# Patient Record
Sex: Female | Born: 1970
Health system: Southern US, Community
[De-identification: ages and names within clinical notes are randomized; demographics above are authoritative.]

## PROBLEM LIST (undated history)

## (undated) DIAGNOSIS — Z87448 Personal history of other diseases of urinary system: Secondary | ICD-10-CM

## (undated) DIAGNOSIS — F419 Anxiety disorder, unspecified: Secondary | ICD-10-CM

## (undated) DIAGNOSIS — T4145XA Adverse effect of unspecified anesthetic, initial encounter: Secondary | ICD-10-CM

## (undated) DIAGNOSIS — T8859XA Other complications of anesthesia, initial encounter: Secondary | ICD-10-CM

## (undated) DIAGNOSIS — N189 Chronic kidney disease, unspecified: Secondary | ICD-10-CM

## (undated) DIAGNOSIS — I10 Essential (primary) hypertension: Secondary | ICD-10-CM

## (undated) DIAGNOSIS — R112 Nausea with vomiting, unspecified: Secondary | ICD-10-CM

## (undated) DIAGNOSIS — K219 Gastro-esophageal reflux disease without esophagitis: Secondary | ICD-10-CM

## (undated) DIAGNOSIS — Z9889 Other specified postprocedural states: Secondary | ICD-10-CM

## (undated) HISTORY — PX: ABDOMINAL HYSTERECTOMY: SHX81

## (undated) HISTORY — PX: TOTAL ABDOMINAL HYSTERECTOMY: SHX209

## (undated) HISTORY — DX: Gastro-esophageal reflux disease without esophagitis: K21.9

## (undated) HISTORY — DX: Personal history of other diseases of urinary system: Z87.448

---

## 2005-02-23 ENCOUNTER — Ambulatory Visit: Payer: Self-pay | Admitting: Family Medicine

## 2005-09-13 ENCOUNTER — Ambulatory Visit: Payer: Self-pay | Admitting: Family Medicine

## 2009-10-22 ENCOUNTER — Encounter (INDEPENDENT_AMBULATORY_CARE_PROVIDER_SITE_OTHER): Payer: Self-pay | Admitting: Obstetrics & Gynecology

## 2009-10-22 ENCOUNTER — Inpatient Hospital Stay (HOSPITAL_COMMUNITY): Admission: RE | Admit: 2009-10-22 | Discharge: 2009-10-23 | Payer: Self-pay | Admitting: Obstetrics & Gynecology

## 2010-09-13 LAB — CBC
Platelets: 237 10*3/uL (ref 150–400)
Platelets: 245 10*3/uL (ref 150–400)
RDW: 14.6 % (ref 11.5–15.5)
WBC: 7.9 10*3/uL (ref 4.0–10.5)

## 2010-09-13 LAB — TYPE AND SCREEN
ABO/RH(D): O POS
Antibody Screen: NEGATIVE

## 2010-09-13 LAB — ABO/RH: ABO/RH(D): O POS

## 2011-03-28 ENCOUNTER — Ambulatory Visit: Payer: 59 | Admitting: Physical Therapy

## 2011-04-07 ENCOUNTER — Ambulatory Visit: Payer: 59 | Attending: Orthopedic Surgery | Admitting: Physical Therapy

## 2011-04-07 DIAGNOSIS — M25539 Pain in unspecified wrist: Secondary | ICD-10-CM | POA: Insufficient documentation

## 2011-04-07 DIAGNOSIS — IMO0001 Reserved for inherently not codable concepts without codable children: Secondary | ICD-10-CM | POA: Insufficient documentation

## 2011-04-12 ENCOUNTER — Ambulatory Visit: Payer: 59 | Admitting: Physical Therapy

## 2011-04-14 ENCOUNTER — Ambulatory Visit: Payer: 59 | Admitting: Rehabilitation

## 2011-04-19 ENCOUNTER — Ambulatory Visit: Payer: 59 | Admitting: Physical Therapy

## 2011-04-21 ENCOUNTER — Encounter: Payer: 59 | Admitting: Rehabilitation

## 2011-04-27 ENCOUNTER — Ambulatory Visit: Payer: 59 | Attending: Orthopedic Surgery | Admitting: Physical Therapy

## 2011-04-27 DIAGNOSIS — IMO0001 Reserved for inherently not codable concepts without codable children: Secondary | ICD-10-CM | POA: Insufficient documentation

## 2011-04-27 DIAGNOSIS — M25539 Pain in unspecified wrist: Secondary | ICD-10-CM | POA: Insufficient documentation

## 2012-11-29 ENCOUNTER — Other Ambulatory Visit (HOSPITAL_COMMUNITY): Payer: Self-pay | Admitting: Family Medicine

## 2012-11-29 DIAGNOSIS — Z1231 Encounter for screening mammogram for malignant neoplasm of breast: Secondary | ICD-10-CM

## 2012-12-03 ENCOUNTER — Ambulatory Visit (HOSPITAL_COMMUNITY)
Admission: RE | Admit: 2012-12-03 | Discharge: 2012-12-03 | Disposition: A | Discharge status: Home / Self Care | Payer: 59 | Source: Ambulatory Visit | Attending: Family Medicine | Admitting: Family Medicine

## 2012-12-03 DIAGNOSIS — Z1231 Encounter for screening mammogram for malignant neoplasm of breast: Secondary | ICD-10-CM | POA: Insufficient documentation

## 2012-12-04 ENCOUNTER — Other Ambulatory Visit: Payer: Self-pay | Admitting: Family Medicine

## 2012-12-04 DIAGNOSIS — R928 Other abnormal and inconclusive findings on diagnostic imaging of breast: Secondary | ICD-10-CM

## 2012-12-18 ENCOUNTER — Ambulatory Visit
Admission: RE | Admit: 2012-12-18 | Discharge: 2012-12-18 | Disposition: A | Discharge status: Home / Self Care | Payer: 59 | Source: Ambulatory Visit | Attending: Family Medicine | Admitting: Family Medicine

## 2012-12-18 DIAGNOSIS — R928 Other abnormal and inconclusive findings on diagnostic imaging of breast: Secondary | ICD-10-CM

## 2013-05-06 ENCOUNTER — Other Ambulatory Visit (HOSPITAL_COMMUNITY): Payer: Self-pay | Admitting: Family

## 2013-05-06 ENCOUNTER — Ambulatory Visit (HOSPITAL_COMMUNITY)
Admission: RE | Admit: 2013-05-06 | Discharge: 2013-05-06 | Disposition: A | Discharge status: Home / Self Care | Payer: 59 | Source: Ambulatory Visit | Attending: Family | Admitting: Family

## 2013-05-06 DIAGNOSIS — Z201 Contact with and (suspected) exposure to tuberculosis: Secondary | ICD-10-CM

## 2013-05-06 DIAGNOSIS — Z111 Encounter for screening for respiratory tuberculosis: Secondary | ICD-10-CM | POA: Insufficient documentation

## 2013-11-21 ENCOUNTER — Other Ambulatory Visit: Payer: Self-pay

## 2013-11-21 DIAGNOSIS — Z1231 Encounter for screening mammogram for malignant neoplasm of breast: Secondary | ICD-10-CM

## 2013-12-02 ENCOUNTER — Encounter: Payer: Self-pay | Admitting: Family Medicine

## 2013-12-02 ENCOUNTER — Ambulatory Visit (INDEPENDENT_AMBULATORY_CARE_PROVIDER_SITE_OTHER): Payer: 59 | Admitting: Family Medicine

## 2013-12-02 VITALS — Ht 66.0 in | Wt 209.3 lb

## 2013-12-02 DIAGNOSIS — E669 Obesity, unspecified: Secondary | ICD-10-CM | POA: Insufficient documentation

## 2013-12-02 NOTE — Patient Instructions (Addendum)
-   Check out the video on emotional eating:  PaylessMeals.at  - Search for [employer] emotional eating.   - Keep in mind the importance of satisfaction from food.    - Cut yourself some slack!  Know that you will be extra busy between now and the end of the year.  Meanwhile, do what you can, and let the rest go.    - Appetite management:  - Start the day with enough food and a meaningful amount of protein (at least 8 oz milk or yogurt).  If having cereal, choose a cereal with at least 5 grams of fiber per serving.  Addn of 1-2 tbsp      nuts/seeds would provide some high-quality fat, which will slow digestion, helping to keep you full longer.    - For each meal, include at least the following:  Protein, starch, and veg's and/or fruit.    - Obtain twice as many veg's as protein or carbohydrate foods for both lunch and dinner.  (To streamline lunch-making, bag up a bunch of carrots/other veg's on Sunday evening.)  - Build in an afternoon snack if you continue to be over-hungry after work.    - Keep problem foods out of the house / out of sight.  (De-convenience junk foods.)  - The key to success in all of the above is advance planning!  - Physical activity:  - Explore the option for standing at work with a computer stand ([employer]).    - Goal:  Walk the dog at least 4 X wk; mark on your calendar how many minutes each time you walk.   - Milkshakes:  Use yogurt instead of ice cream.  (Add sugar if using plain yogurt.)

## 2013-12-02 NOTE — Progress Notes (Signed)
Medical Nutrition Therapy:  Appt start time: 0900 end time:  1000.  Assessment:  Primary concerns today: Weight management.  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  She wants to lose weight and to get off her BP med.  She is currently working on a Conservator, museum/gallery in nursing [employer], mostly online), which she'll complete in Dec.  This has meant a lot of added stress and time constraints for her, and has likely contributed to difficulty managing her weight.  About 6 years ago she lost ~50 lb, most of which she has regained, a source of frustration for her.      Usual eating pattern includes 3 meals and 0-2 snacks per day. Usual physical activity includes walking the dog ~2 mi 3 X wk.    Frequent foods include 1 c 2% milk, water, 4 c decaf, 12 oz diet soda.  Avoided foods include meat (still eats fish, eggs, and dairy).    24-hr recall suggests an intake of ~2100 kcal: (Up at 6 AM; mtg before breakfast at work) B (8:30 AM)-   1 1/2 c oatmeal w/ 2 tsp butter and 2 tbsp sugar, 12 oz 2% milk, 1 c decaf  600 Snk ( AM)-    L (2 PM)-  ~1/2 c hummus, 1/2 c pretzels, diet Sprite       200 Snk ( PM)-   D (5 PM)-  1 pc quiche, raw veg's w/ 2 tbsp ranch dip, diet Coke    570 Snk ( PM)-  24 oz banana milkshake (1/2 c 2% milk, 1/2 c soymilk, 1 banana, 2 c l-f ice crm 30   Yesterday was atypical b/c she had to go to work early.  More typical is cereal (Shr Wht/Cheerios) at home.  Often she brings lunch, pb & b sandw or apple w/ cheese.  She is usually ravenous when she gets home from work.    Progress Towards Goal(s):  In progress.   Nutritional Diagnosis:  NI-1.5 Excessive energy intake As related to expenditure.  As evidenced by imbalanced food distribution (inadequate intake pre-dinner).    Intervention:  Nutrition education.  Monitoring/Evaluation:  Dietary intake, exercise, and body weight in 6 week(s).

## 2013-12-24 ENCOUNTER — Ambulatory Visit
Admission: RE | Admit: 2013-12-24 | Discharge: 2013-12-24 | Disposition: A | Discharge status: Home / Self Care | Payer: 59 | Source: Ambulatory Visit

## 2013-12-24 DIAGNOSIS — Z1231 Encounter for screening mammogram for malignant neoplasm of breast: Secondary | ICD-10-CM

## 2014-01-12 ENCOUNTER — Ambulatory Visit (INDEPENDENT_AMBULATORY_CARE_PROVIDER_SITE_OTHER): Payer: 59 | Admitting: Family Medicine

## 2014-01-12 ENCOUNTER — Encounter: Payer: Self-pay | Admitting: Family Medicine

## 2014-01-12 VITALS — Ht 66.0 in | Wt 210.9 lb

## 2014-01-12 DIAGNOSIS — E669 Obesity, unspecified: Secondary | ICD-10-CM

## 2014-01-12 NOTE — Progress Notes (Signed)
Medical Nutrition Therapy:  Appt start time: 0900 end time:  1000.  Assessment:  Primary concerns today: Weight management.  Jackie Mcconnell did well for 3 weeks, followed by 2 weeks where she walked only 2 X wk, and food choices were somewhat compromised.  Beginning next week she has 5 weeks of no classes, so this should be a little less stressful for her.  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  Jackie Mcconnell has ordered a stand for her computer, which will allow her to stand at times during the work day.    24-hr recall:  (Up at 9:30 AM) B (9:30 AM)-  1 1/2 c Cheerios, 1 c 2% milk, 1/3 c blueberries, black coffee Snk (12 PM)-  Nutrigrain waffle, 1 c orange juice L (2 PM)-  1 slc spinach quiche, salad, ranch drsng, diet soda Snk ( PM)-  1 handful of pretzels, 1 glass wine D ( PM)-  1 slc spinach quiche, 1 glass white wine Snk ( PM)-  1 Nutty Buddy (125 kcal) On most workdays, Jackie Mcconnell usually has peanut butter for lunch, but no protein for dinner.    Progress Towards Goal(s):  In progress.   Nutritional Diagnosis:  NI-1.5 Excessive energy intake As related to expenditure.  As evidenced by imbalanced food distribution (inadequate protein, excess carbohydrate, inadequate physical activity).    Intervention:  Nutrition education.  Monitoring/Evaluation:  Dietary intake, exercise, and body weight in 4 week(s).

## 2014-01-12 NOTE — Patient Instructions (Signed)
(-   Talk with your dept Engagement Davonna Belling about meeting with other dept Engagement Champions.)  - Protein needs:  0.8 g per kg.  For a weight of 145, this equals 66 grams a day.    - Food goals:  1. Protein with every meal.    2. Plan the week's meals on Saturday or Sunday.    3. Walk the dog at least 4 X wk, AND do one other physical activity per week.     - Continue to use micro-breaks at work:  Move 1 minute for every 20 you sit OR 5 minutes for every hour you sit.    - Protein sources:   Any soy products, beans (including hummus) (have at least 1 full cup per serving), dairy, and fish.

## 2014-02-09 ENCOUNTER — Encounter: Payer: Self-pay | Admitting: Family Medicine

## 2014-02-09 ENCOUNTER — Ambulatory Visit (INDEPENDENT_AMBULATORY_CARE_PROVIDER_SITE_OTHER): Payer: 59 | Admitting: Family Medicine

## 2014-02-09 VITALS — Ht 66.0 in | Wt 214.5 lb

## 2014-02-09 DIAGNOSIS — E669 Obesity, unspecified: Secondary | ICD-10-CM

## 2014-02-09 NOTE — Progress Notes (Signed)
Medical Nutrition Therapy:  Appt start time: 1630 end time:  1730.  Assessment:  Primary concerns today: Weight management.  Zayah has been exercising regularly, doing at least 4 X wk.  In addn to walking the dog, she has also hiked, esercised at home, and [facility]  She has been doing better getting protein, but not with every meal.  If no food protein, she has a cup of milk or a cheese stick with dinner.  (We discussed that neither of these choices is actually a meaningful amount of protein per meal.)  She has bought canned beans, but has not used any of them yet.  She has been preparing some foods on the weekends for the week ahead, which has made staying on track with lunch easier.    Usual intake includes milk with cereal or yogurt plus fruit for breakfast; lunch is usually salad and 2 boiled eggs and cheese OR cooked greens and a string cheese OR dinner leftovers.  Desaray is using milk, cheese, and nuts frequently to help meet her protein needs.    Shine is feeling frustrated that she has not lost weight (today's wt chk indicated almost 5 lb gained) b/c she feels that she's made some significant behavior changes.  In reviewing changes, she did say her exercise (walking the dog) is usually only 1 1/2 miles at an 18-minute mile pace.  Also recognizes protein continues to be inadequate.    24-hr recall:  (Up at 8 AM; out of town; driving home) B ( AM)-  Water, coffee Snk ( AM)-   L (11 AM)-  Crepe with poached eggs, spin, caulifr, chs, 3 binets, salad, vinaigrette, coffee, 16 oz 2% milk, 4 oz o.j. Snk ( PM)-   D (7:30 PM)-  1 hotdog in bun, slaw, chili, onions, 1 1/2 svngs chips, 1/2 c mac salad, 1 slc chs, b'day cake, 3/4 c ice crm, 12 oz sweet tea Snk ( PM)-   Yesterday was atypical in that Iola ate brunch instead of bkfst and lunch, and she attended a birthday party for dinner, where she ate meat b/c there were no alternatives.    Progress Towards Goal(s):  In  progress.   Nutritional Diagnosis:  NI-1.5 Excessive energy intake As related to expenditure.  As evidenced by imbalanced food distribution (inadequate protein, excess carbohydrate, inadequate physical activity).    Intervention:  Nutrition education.  Monitoring/Evaluation:  Dietary intake, exercise, and body weight in 6 week(s).

## 2014-02-09 NOTE — Patient Instructions (Signed)
-   Cheese and nuts are high-fat, high-calorie foods with some protein, but not a lot of protein, i.e., 1 oz cheese = 8-10 g fat, 7 g protein. - Beans are a low-fat, reasonable-calorie protein source.  1/2 can is the minimum serving size to get a meaningful amount of protein.    - Use on salads, microwave-heated with cooked veg's and a starch.    - Other stuff to do with beans:  Hummus; pureed beans for burritos or on salads.   - Veg's:  Try the dip seasoning mixed in all or half plain non-fat Greek yogurt in place of sour cream.    Keep previous goals (with protein goal modified): 1. Obtain at least 66 grams of protein per day.    2. Plan the week's meals on Saturday or Sunday.   3. Walk the dog 30 min at least 4 X wk, followed by an additional walk/run for 20 min.  Complete goals sheet, and bring to follow-up.    Reminder:  Wire mixer for protein drinks; Kale and herb stripper at [employer].

## 2014-03-16 ENCOUNTER — Other Ambulatory Visit: Payer: Self-pay | Admitting: Family Medicine

## 2014-03-16 DIAGNOSIS — R1900 Intra-abdominal and pelvic swelling, mass and lump, unspecified site: Secondary | ICD-10-CM

## 2014-03-17 ENCOUNTER — Other Ambulatory Visit (HOSPITAL_COMMUNITY): Payer: Self-pay | Admitting: Family Medicine

## 2014-03-17 DIAGNOSIS — R10812 Left upper quadrant abdominal tenderness: Secondary | ICD-10-CM

## 2014-03-19 ENCOUNTER — Ambulatory Visit (HOSPITAL_COMMUNITY): Payer: 59

## 2014-03-20 ENCOUNTER — Ambulatory Visit (HOSPITAL_COMMUNITY)
Admission: RE | Admit: 2014-03-20 | Discharge: 2014-03-20 | Disposition: A | Discharge status: Home / Self Care | Payer: 59 | Source: Ambulatory Visit | Attending: Family Medicine | Admitting: Family Medicine

## 2014-03-20 ENCOUNTER — Encounter (HOSPITAL_COMMUNITY): Payer: Self-pay

## 2014-03-20 DIAGNOSIS — R10812 Left upper quadrant abdominal tenderness: Secondary | ICD-10-CM | POA: Diagnosis present

## 2014-03-20 MED ORDER — IOHEXOL 300 MG/ML  SOLN
100.0000 mL | Freq: Once | INTRAMUSCULAR | Status: AC | PRN
  Administered 2014-03-20: 100 mL via INTRAVENOUS

## 2014-03-26 ENCOUNTER — Ambulatory Visit: Payer: 59 | Admitting: Family Medicine

## 2014-03-26 ENCOUNTER — Ambulatory Visit (HOSPITAL_COMMUNITY)
Admission: RE | Admit: 2014-03-26 | Discharge: 2014-03-26 | Disposition: A | Discharge status: Home / Self Care | Payer: 59 | Source: Ambulatory Visit | Attending: Urology | Admitting: Urology

## 2014-03-26 ENCOUNTER — Other Ambulatory Visit (HOSPITAL_COMMUNITY): Payer: Self-pay | Admitting: Urology

## 2014-03-26 DIAGNOSIS — R934 Abnormal findings on diagnostic imaging of urinary organs: Secondary | ICD-10-CM | POA: Insufficient documentation

## 2014-03-26 DIAGNOSIS — N2889 Other specified disorders of kidney and ureter: Secondary | ICD-10-CM

## 2014-03-26 DIAGNOSIS — I10 Essential (primary) hypertension: Secondary | ICD-10-CM | POA: Insufficient documentation

## 2014-03-27 ENCOUNTER — Other Ambulatory Visit: Payer: Self-pay | Admitting: Urology

## 2014-03-30 ENCOUNTER — Encounter (HOSPITAL_COMMUNITY): Payer: Self-pay | Admitting: Pharmacy Technician

## 2014-04-02 ENCOUNTER — Encounter (HOSPITAL_COMMUNITY): Payer: Self-pay

## 2014-04-02 ENCOUNTER — Encounter (HOSPITAL_COMMUNITY)
Admission: RE | Admit: 2014-04-02 | Discharge: 2014-04-02 | Disposition: A | Discharge status: Home / Self Care | Payer: 59 | Source: Ambulatory Visit | Attending: Urology | Admitting: Urology

## 2014-04-02 DIAGNOSIS — Z0000 Encounter for general adult medical examination without abnormal findings: Secondary | ICD-10-CM | POA: Diagnosis present

## 2014-04-02 DIAGNOSIS — Z6836 Body mass index (BMI) 36.0-36.9, adult: Secondary | ICD-10-CM | POA: Insufficient documentation

## 2014-04-02 DIAGNOSIS — E669 Obesity, unspecified: Secondary | ICD-10-CM | POA: Diagnosis not present

## 2014-04-02 DIAGNOSIS — N2889 Other specified disorders of kidney and ureter: Secondary | ICD-10-CM | POA: Diagnosis not present

## 2014-04-02 DIAGNOSIS — N189 Chronic kidney disease, unspecified: Secondary | ICD-10-CM | POA: Diagnosis not present

## 2014-04-02 DIAGNOSIS — I129 Hypertensive chronic kidney disease with stage 1 through stage 4 chronic kidney disease, or unspecified chronic kidney disease: Secondary | ICD-10-CM | POA: Insufficient documentation

## 2014-04-02 HISTORY — DX: Other complications of anesthesia, initial encounter: T88.59XA

## 2014-04-02 HISTORY — DX: Nausea with vomiting, unspecified: R11.2

## 2014-04-02 HISTORY — DX: Anxiety disorder, unspecified: F41.9

## 2014-04-02 HISTORY — DX: Chronic kidney disease, unspecified: N18.9

## 2014-04-02 HISTORY — DX: Adverse effect of unspecified anesthetic, initial encounter: T41.45XA

## 2014-04-02 HISTORY — DX: Essential (primary) hypertension: I10

## 2014-04-02 HISTORY — DX: Other specified postprocedural states: Z98.890

## 2014-04-02 HISTORY — DX: Nausea with vomiting, unspecified: Z98.890

## 2014-04-02 LAB — CBC
HCT: 42.2 % (ref 36.0–46.0)
HEMOGLOBIN: 14.8 g/dL (ref 12.0–15.0)
MCH: 31 pg (ref 26.0–34.0)
MCHC: 35.1 g/dL (ref 30.0–36.0)
MCV: 88.5 fL (ref 78.0–100.0)
Platelets: 255 10*3/uL (ref 150–400)
RBC: 4.77 MIL/uL (ref 3.87–5.11)
RDW: 12.9 % (ref 11.5–15.5)
WBC: 8.3 10*3/uL (ref 4.0–10.5)

## 2014-04-02 LAB — BASIC METABOLIC PANEL
ANION GAP: 14 (ref 5–15)
BUN: 10 mg/dL (ref 6–23)
CO2: 25 mEq/L (ref 19–32)
Calcium: 9.7 mg/dL (ref 8.4–10.5)
Chloride: 102 mEq/L (ref 96–112)
Creatinine, Ser: 0.85 mg/dL (ref 0.50–1.10)
GFR calc Af Amer: >90 mL/min (ref >90)
GFR calc non Af Amer: 83 mL/min — ABNORMAL LOW (ref >90)
Glucose, Bld: 107 mg/dL — ABNORMAL HIGH (ref 70–99)
Potassium: 3.8 mEq/L (ref 3.7–5.3)
Sodium: 141 mEq/L (ref 137–147)

## 2014-04-02 NOTE — Patient Instructions (Addendum)
[place]  04/02/2014   Your procedure is scheduled on:  10-12 -2015 Monday  Enter through [facility] Entr[facility] and follow signs to [facility]. Arrive at   [place]     AM   Call this number if you have problems the morning of surgery: [phone]  Or Presurgical Testing [phone].   For Living Will and/or Health Care Power Attorney Forms: please provide copy for your medical record,may bring AM of surgery(Forms should be already notarized -we do not provide this service).(04-02-14 information prvovided today and place with chart).  Remember: Follow any bowel prep instructions per MD office.    Do not eat food/ or drink: After Midnight.      Take these medicines the morning of surgery with A SIP OF WATER: Aprazolam(if needed)   Do not wear jewelry, make-up or nail polish.  Do not wear deodorant, lotions, powders, or perfumes.   Do not shave legs and under arms- 48 hours(2 days) prior to first CHG shower.(Shaving face and neck okay.)  Do not bring valuables to the hospital.(Hospital is not responsible for lost valuables).  Contacts, dentures or removable bridgework, body piercing, hair pins may not be worn into surgery.  Leave suitcase in the car. After surgery it may be brought to your room.  For patients admitted to the hospital, checkout time is 11:00 AM the day of discharge.(Restricted visitors-Any Persons displaying flu-like symptoms or illness).    Patients discharged the day of surgery will not be allowed to drive home. Must have responsible person with you x 24 hours once discharged.  Name and phone number of your driver: coriann, brouhard -[phone] cell     Please read over the following fact sheets that you were given:  CHG(Chlorhexidine Gluconate 4% Surgical Soap) use, MRSA Information, Blood Transfusion fact sheet, Incentive Spirometry Instruction.  Remember : Type/Screen "Blue armbands" - may not be removed once applied(would result in being  retested AM of surgery, if removed).         [place] - Preparing for Surgery Before surgery, you can play an important role.  Because skin is not sterile, your skin needs to be as free of germs as possible.  You can reduce the number of germs on your skin by washing with CHG (chlorahexidine gluconate) soap before surgery.  CHG is an antiseptic cleaner which kills germs and bonds with the skin to continue killing germs even after washing. Please DO NOT use if you have an allergy to CHG or antibacterial soaps.  If your skin becomes reddened/irritated stop using the CHG and inform your nurse when you arrive at Short Stay. Do not shave (including legs and underarms) for at least 48 hours prior to the first CHG shower.  You may shave your face/neck. Please follow these instructions carefully:  1.  Shower with CHG Soap the night before surgery and the  morning of Surgery.  2.  If you choose to wash your hair, wash your hair first as usual with your  normal  shampoo.  3.  After you shampoo, rinse your hair and body thoroughly to remove the  shampoo.                           4.  Use CHG as you would any other liquid soap.  You can apply chg directly  to the skin and wash  Gently with a scrungie or clean washcloth.  5.  Apply the CHG Soap to your body ONLY FROM THE NECK DOWN.   Do not use on face/ open                           Wound or open sores. Avoid contact with eyes, ears mouth and genitals (private parts).                       Wash face,  Genitals (private parts) with your normal soap.             6.  Wash thoroughly, paying special attention to the area where your surgery  will be performed.  7.  Thoroughly rinse your body with warm water from the neck down.  8.  DO NOT shower/wash with your normal soap after using and rinsing off  the CHG Soap.                9.  Pat yourself dry with a clean towel.            10.  Wear clean pajamas.            11.  Place clean  sheets on your bed the night of your first shower and do not  sleep with pets. Day of Surgery : Do not apply any lotions/deodorants the morning of surgery.  Please wear clean clothes to the hospital/surgery center.  FAILURE TO FOLLOW THESE INSTRUCTIONS MAY RESULT IN THE CANCELLATION OF YOUR SURGERY PATIENT SIGNATURE_________________________________  NURSE SIGNATURE__________________________________  ________________________________________________________________________   Adam Phenix  An incentive spirometer is a tool that can help keep your lungs clear and active. This tool measures how well you are filling your lungs with each breath. Taking long deep breaths may help reverse or decrease the chance of developing breathing (pulmonary) problems (especially infection) following:  A long period of time when you are unable to move or be active. BEFORE THE PROCEDURE   If the spirometer includes an indicator to show your best effort, your nurse or respiratory therapist will set it to a desired goal.  If possible, sit up straight or lean slightly forward. Try not to slouch.  Hold the incentive spirometer in an upright position. INSTRUCTIONS FOR USE  1. Sit on the edge of your bed if possible, or sit up as far as you can in bed or on a chair. 2. Hold the incentive spirometer in an upright position. 3. Breathe out normally. 4. Place the mouthpiece in your mouth and seal your lips tightly around it. 5. Breathe in slowly and as deeply as possible, raising the piston or the ball toward the top of the column. 6. Hold your breath for 3-5 seconds or for as long as possible. Allow the piston or ball to fall to the bottom of the column. 7. Remove the mouthpiece from your mouth and breathe out normally. 8. Rest for a few seconds and repeat Steps 1 through 7 at least 10 times every 1-2 hours when you are awake. Take your time and take a few normal breaths between deep breaths. 9. The  spirometer may include an indicator to show your best effort. Use the indicator as a goal to work toward during each repetition. 10. After each set of 10 deep breaths, practice coughing to be sure your lungs are clear. If you have an incision (the cut made at the time of  surgery), support your incision when coughing by placing a pillow or rolled up towels firmly against it. Once you are able to get out of bed, walk around indoors and cough well. You may stop using the incentive spirometer when instructed by your caregiver.  RISKS AND COMPLICATIONS  Take your time so you do not get dizzy or light-headed.  If you are in pain, you may need to take or ask for pain medication before doing incentive spirometry. It is harder to take a deep breath if you are having pain. AFTER USE  Rest and breathe slowly and easily.  It can be helpful to keep track of a log of your progress. Your caregiver can provide you with a simple table to help with this. If you are using the spirometer at home, follow these instructions: [place] IF:   You are having difficultly using the spirometer.  You have trouble using the spirometer as often as instructed.  Your pain medication is not giving enough relief while using the spirometer.  You develop fever of 100.5 F (38.1 C) or higher. SEEK IMMEDIATE MEDICAL CARE IF:   You cough up bloody sputum that had not been present before.  You develop fever of 102 F (38.9 C) or greater.  You develop worsening pain at or near the incision site. MAKE SURE YOU:   Understand these instructions.  Will watch your condition.  Will get help right away if you are not doing well or get worse. Document Released: 10/23/2006 Document Revised: 09/04/2011 Document Reviewed: 12/24/2006 ExitCare Patient Information 2014 [employer], [place].   ________________________________________________________________________  WHAT IS A BLOOD TRANSFUSION? Blood Transfusion  Information  A transfusion is the replacement of blood or some of its parts. Blood is made up of multiple cells which provide different functions.  Red blood cells carry oxygen and are used for blood loss replacement.  White blood cells fight against infection.  Platelets control bleeding.  Plasma helps clot blood.  Other blood products are available for specialized needs, such as hemophilia or other clotting disorders. BEFORE THE TRANSFUSION  Who gives blood for transfusions?   Healthy volunteers who are fully evaluated to make sure their blood is safe. This is blood bank blood. Transfusion therapy is the safest it has ever been in the practice of medicine. Before blood is taken from a donor, a complete history is taken to make sure that person has no history of diseases nor engages in risky social behavior (examples are intravenous drug use or sexual activity with multiple partners). The donor's travel history is screened to minimize risk of transmitting infections, such as malaria. The donated blood is tested for signs of infectious diseases, such as HIV and hepatitis. The blood is then tested to be sure it is compatible with you in order to minimize the chance of a transfusion reaction. If you or a relative donates blood, this is often done in anticipation of surgery and is not appropriate for emergency situations. It takes many days to process the donated blood. RISKS AND COMPLICATIONS Although transfusion therapy is very safe and saves many lives, the main dangers of transfusion include:   Getting an infectious disease.  Developing a transfusion reaction. This is an allergic reaction to something in the blood you were given. Every precaution is taken to prevent this. The decision to have a blood transfusion has been considered carefully by your caregiver before blood is given. Blood is not given unless the benefits outweigh the risks. AFTER THE TRANSFUSION  Right after receiving a  blood transfusion, you will usually feel much better and more energetic. This is especially true if your red blood cells have gotten low (anemic). The transfusion raises the level of the red blood cells which carry oxygen, and this usually causes an energy increase.  The nurse administering the transfusion will monitor you carefully for complications. HOME CARE INSTRUCTIONS  No special instructions are needed after a transfusion. You may find your energy is better. Speak with your caregiver about any limitations on activity for underlying diseases you may have. SEEK MEDICAL CARE IF:   Your condition is not improving after your transfusion.  You develop redness or irritation at the intravenous (IV) site. SEEK IMMEDIATE MEDICAL CARE IF:  Any of the following symptoms occur over the next 12 hours:  Shaking chills.  You have a temperature by mouth above 102 F (38.9 C), not controlled by medicine.  Chest, back, or muscle pain.  People around you feel you are not acting correctly or are confused.  Shortness of breath or difficulty breathing.  Dizziness and fainting.  You get a rash or develop hives.  You have a decrease in urine output.  Your urine turns a dark color or changes to pink, red, or brown. Any of the following symptoms occur over the next 10 days:  You have a temperature by mouth above 102 F (38.9 C), not controlled by medicine.  Shortness of breath.  Weakness after normal activity.  The white part of the eye turns yellow (jaundice).  You have a decrease in the amount of urine or are urinating less often.  Your urine turns a dark color or changes to pink, red, or brown. Document Released: 06/09/2000 Document Revised: 09/04/2011 Document Reviewed: 01/27/2008 [facility] Patient Information 2014 [place], [place].  _______________________________________________________________________

## 2014-04-03 NOTE — Anesthesia Preprocedure Evaluation (Addendum)
Anesthesia Evaluation  Patient identified by MRN, date of birth, ID band Patient awake    Reviewed: Allergy & Precautions, H&P , NPO status , Patient's Chart, lab work & pertinent test results  History of Anesthesia Complications (+) PONV and history of anesthetic complications  Airway Mallampati: II TM Distance: >3 FB Neck ROM: Full    Dental no notable dental hx.    Pulmonary neg pulmonary ROS,  breath sounds clear to auscultation  Pulmonary exam normal       Cardiovascular hypertension, Pt. on medications negative cardio ROS  Rhythm:Regular Rate:Normal     Neuro/Psych Anxiety negative neurological ROS     GI/Hepatic negative GI ROS, Neg liver ROS,   Endo/Other  negative endocrine ROS  Renal/GU Renal disease  negative genitourinary   Musculoskeletal negative musculoskeletal ROS (+)   Abdominal (+) + obese,   Peds negative pediatric ROS (+)  Hematology negative hematology ROS (+)   Anesthesia Other Findings   Reproductive/Obstetrics negative OB ROS                         Anesthesia Physical Anesthesia Plan  ASA: II  Anesthesia Plan: General and Epidural   Post-op Pain Management:    Induction: Intravenous  Airway Management Planned: Oral ETT  Additional Equipment:   Intra-op Plan:   Post-operative Plan: Extubation in OR  Informed Consent: I have reviewed the patients History and Physical, chart, labs and discussed the procedure including the risks, benefits and alternatives for the proposed anesthesia with the patient or authorized representative who has indicated his/her understanding and acceptance.   Dental advisory given  Plan Discussed with: CRNA  Anesthesia Plan Comments: (Discussed risks/benefits of epidural including headache, backache, failure, bleeding, infection, and nerve damage. Patient consents to epidural. Questions answered. platelet count acceptable. No  h/o bleeding problems. Had epidural for childbirth 23 years ago..Dr. Alinda Money planning open procedure and requests anesthesia evaluation for thoracic epidural for pain control.)      Anesthesia Quick Evaluation

## 2014-04-03 NOTE — H&P (Signed)
Chief Complaint Left renal mass   History of Present Illness Jackie Mcconnell is a pleasant 43 year old female seen today at the request of Dr. Antony Blackbird due to a left renal mass. He states that approximately 1-2 weeks ago, she fell a left upper quadrant abdominal mass and also noted some pressure and discomfort in her left upper quadrant with some radiation down to her pelvis. She was seen by Dr. Chapman Fitch and underwent a CT scan of the abdomen and pelvis with contrast for further evaluation. This demonstrated a large left renal mass measuring 16.8 x 13 cm. The mass was characterized as a multicystic lesion with numerous septations. No focal nodular mass was noted. There was no pathologic regional lymphadenopathy, evidence of renal vein or IVC tumor thrombus, contralateral renal abnormalities, or other evidence of metastatic disease. There is minimal normal left renal parenchyma with the majority of her kidney completely replaced by this cystic mass. Recent laboratory work is demonstrated a hemoglobin of 15.0 and platelet count 266. Serum creatinine is 0.96 and liver function tests are normal. She denies any gross hematuria and only states that she has had some increased urinary frequency and nocturia over the past year but relates this to increase water intake in the evenings. Furthermore, she denies a family history of kidney cancer.   Past Medical History Problems  1. History of Anxiety (300.00) 2. History of endometriosis (V13.29) 3. History of hypertension (V12.59)  Surgical History Problems  1. History of Hysterectomy  Current Meds 1. AmLODIPine Besylate 10 MG Oral Tablet;  Therapy: (Recorded:28Sep2015) to Recorded 2. Calcium + D TABS;  Therapy: (Recorded:28Sep2015) to Recorded 3. Cyclobenzaprine HCl - 5 MG Oral Tablet;  Therapy: (Recorded:28Sep2015) to Recorded 4. Estradiol 0.5 MG Oral Tablet;  Therapy: (Recorded:28Sep2015) to Recorded 5. Meloxicam 15 MG Oral Tablet;  Therapy:  (Recorded:28Sep2015) to Recorded 6. Vitamin D 1000 UNIT Oral Tablet;  Therapy: (Recorded:28Sep2015) to Recorded  Allergies Medication  1. Neosporin OINT 2. Sulfa Drugs  Family History Problems  1. Family history of diabetes mellitus (V18.0) : Grandmother 2. Family history of hyperlipidemia (V18.19) : Mother 3. Family history of hypertension (V17.49) : Mother, Father  Social History Problems    Alcohol use (V49.89)   Denied: History of Caffeine use   Never a smoker   Single   engaged  Review of Systems Genitourinary, constitutional, skin, eye, otolaryngeal, hematologic/lymphatic, cardiovascular, pulmonary, endocrine, musculoskeletal, gastrointestinal, neurological and psychiatric system(s) were reviewed and pertinent findings if present are noted.  Gastrointestinal: heartburn and constipation.  Psychiatric: anxiety.    Vitals Vital Signs [Data Includes: Last 1 Day]  Recorded: 01Oct2015 02:39PM  Height: 5 ft 6 in Weight: 205 lb  BMI Calculated: 33.09 BSA Calculated: 2.02 Blood Pressure: 136 / 91 Heart Rate: 72  Physical Exam Constitutional: Well nourished and well developed . No acute distress.  ENT:. The ears and nose are normal in appearance.  Neck: The appearance of the neck is normal and no neck mass is present.  Pulmonary: No respiratory distress, normal respiratory rhythm and effort and clear bilateral breath sounds.  Cardiovascular: Heart rate and rhythm are normal . No peripheral edema.  Abdomen: The abdomen is soft and nontender. No CVA tenderness. No hernias are palpable. No hepatosplenomegaly noted. She does have a palpable mass in the left upper quadrant which is nontender.  Lymphatics: The supraclavicular, femoral and inguinal nodes are not enlarged or tender.  Skin: Normal skin turgor, no visible rash and no visible skin lesions.  Neuro/Psych:. Mood and affect are appropriate.  Results/Data Urine [Data Includes: Last 1 Day]   01Oct2015  COLOR  YELLOW   APPEARANCE CLEAR   SPECIFIC GRAVITY <1.005   pH 5.5   GLUCOSE NEG mg/dL  BILIRUBIN NEG   KETONE NEG mg/dL  BLOOD NEG   PROTEIN TRACE mg/dL  UROBILINOGEN 0.2 mg/dL  NITRITE NEG   LEUKOCYTE ESTERASE MOD   SQUAMOUS EPITHELIAL/HPF FEW   WBC 7-10 WBC/hpf  RBC NONE SEEN RBC/hpf  BACTERIA FEW   CRYSTALS NONE SEEN   CASTS NONE SEEN    I have independently reviewed her medical records and independently reviewed her CT scan dated 03/20/14. Findings are as dictated above.   Assessment Assessed  1. Renal mass (593.9)  Plan Health Maintenance  1. UA With REFLEX; [Do Not Release]; Status:Complete;   Done: [id number] 03:59PM Renal mass  2. Follow-up Office  Follow-up - will call to schedule surgery  Status: Complete  Done:  01Oct2015  Discussion/Summary 1. Left renal mass: Her imaging findings are most consistent with a multilocular cystic nephroma. I explained to her that this is a benign entity. However, there is a possibility that this could also be a renal malignancy and technically this would be a Bosniak 3 cystic renal mass. She understands that the only way to definitively make a diagnosis would be with surgical pathology. Considering the risk of malignancy as well as the fact that it appears her left kidney does not contribute significant function to her global renal function, I have recommended that she proceed with a left radical nephrectomy. Considering the size of the mass, I do believe this will need to be performed with an open incision and I recommended we proceed with a left subcostal/Chevron incision for treatment. She understands the reasons for these recommendations and gives her consent to proceed as indicated.   We have discussed the risks of treatment in detail including but not limited to bleeding, infection, heart attack, stroke, death, venothromoboembolism, cancer recurrence, injury/damage to surrounding organs and structures, urine leak, the possibility of open  surgical conversion for patients undergoing minimally invasive surgery, the risk of developing chronic kidney disease and its associated implications, and the potential risk of end stage renal disease possibly necessitating dialysis.     She will undergo chest imaging today to complete her metastatic evaluation will be scheduled for a left open radical nephrectomy in the near future. I will talk with anesthesia about placing an epidural for postoperative pain control.    Cc: Dr. Antony Blackbird    SignaturesElectronically signed by : Raynelle Bring, M.D.; Mar 26 2014  5:28PM EST

## 2014-04-06 ENCOUNTER — Ambulatory Visit (HOSPITAL_COMMUNITY): Payer: 59 | Admitting: Anesthesiology

## 2014-04-06 ENCOUNTER — Encounter (HOSPITAL_COMMUNITY)
Admission: RE | Disposition: A | Discharge status: Home / Self Care | Payer: Self-pay | Source: Ambulatory Visit | Attending: Urology

## 2014-04-06 ENCOUNTER — Inpatient Hospital Stay (HOSPITAL_COMMUNITY)
Admission: RE | Admit: 2014-04-06 | Discharge: 2014-04-10 | DRG: 658 | Disposition: A | Discharge status: Home / Self Care | Payer: 59 | Source: Ambulatory Visit | Attending: Urology | Admitting: Urology

## 2014-04-06 ENCOUNTER — Encounter (HOSPITAL_COMMUNITY): Payer: Self-pay | Admitting: *Deleted

## 2014-04-06 ENCOUNTER — Encounter (HOSPITAL_COMMUNITY): Payer: 59 | Admitting: Anesthesiology

## 2014-04-06 DIAGNOSIS — I959 Hypotension, unspecified: Secondary | ICD-10-CM | POA: Diagnosis not present

## 2014-04-06 DIAGNOSIS — I10 Essential (primary) hypertension: Secondary | ICD-10-CM | POA: Diagnosis present

## 2014-04-06 DIAGNOSIS — N2889 Other specified disorders of kidney and ureter: Secondary | ICD-10-CM | POA: Diagnosis present

## 2014-04-06 DIAGNOSIS — N951 Menopausal and female climacteric states: Secondary | ICD-10-CM | POA: Diagnosis present

## 2014-04-06 DIAGNOSIS — Z01812 Encounter for preprocedural laboratory examination: Secondary | ICD-10-CM

## 2014-04-06 DIAGNOSIS — D3002 Benign neoplasm of left kidney: Principal | ICD-10-CM | POA: Diagnosis present

## 2014-04-06 DIAGNOSIS — Z6833 Body mass index (BMI) 33.0-33.9, adult: Secondary | ICD-10-CM

## 2014-04-06 DIAGNOSIS — E669 Obesity, unspecified: Secondary | ICD-10-CM | POA: Diagnosis present

## 2014-04-06 DIAGNOSIS — D49519 Neoplasm of unspecified behavior of unspecified kidney: Secondary | ICD-10-CM | POA: Diagnosis present

## 2014-04-06 DIAGNOSIS — Z90710 Acquired absence of both cervix and uterus: Secondary | ICD-10-CM | POA: Diagnosis not present

## 2014-04-06 HISTORY — PX: NEPHRECTOMY: SHX65

## 2014-04-06 LAB — BASIC METABOLIC PANEL
ANION GAP: 11 (ref 5–15)
BUN: 6 mg/dL (ref 6–23)
CO2: 25 meq/L (ref 19–32)
CREATININE: 1.09 mg/dL (ref 0.50–1.10)
Calcium: 8.5 mg/dL (ref 8.4–10.5)
Chloride: 107 mEq/L (ref 96–112)
GFR calc Af Amer: 72 mL/min — ABNORMAL LOW (ref >90)
GFR calc non Af Amer: 62 mL/min — ABNORMAL LOW (ref >90)
Glucose, Bld: 137 mg/dL — ABNORMAL HIGH (ref 70–99)
Potassium: 3.8 mEq/L (ref 3.7–5.3)
Sodium: 143 mEq/L (ref 137–147)

## 2014-04-06 LAB — HEMOGLOBIN AND HEMATOCRIT, BLOOD
HCT: 39.7 % (ref 36.0–46.0)
Hemoglobin: 13.5 g/dL (ref 12.0–15.0)

## 2014-04-06 LAB — TYPE AND SCREEN
ABO/RH(D): O POS
ANTIBODY SCREEN: NEGATIVE

## 2014-04-06 SURGERY — NEPHRECTOMY
Anesthesia: Epidural | Laterality: Left

## 2014-04-06 MED ORDER — OXYCODONE-ACETAMINOPHEN 5-325 MG PO TABS: 1.0000 | ORAL_TABLET | ORAL | Status: DC | PRN

## 2014-04-06 MED ORDER — SODIUM CHLORIDE 0.9 % IJ SOLN: 9.0000 mL | INTRAMUSCULAR | Status: DC | PRN

## 2014-04-06 MED ORDER — ONDANSETRON HCL 4 MG/2ML IJ SOLN
INTRAMUSCULAR | Status: AC
  Filled 2014-04-06: qty 2

## 2014-04-06 MED ORDER — DIPHENHYDRAMINE HCL 50 MG/ML IJ SOLN: 12.5000 mg | Freq: Four times a day (QID) | INTRAMUSCULAR | Status: DC | PRN

## 2014-04-06 MED ORDER — SODIUM CHLORIDE 0.9 % IJ SOLN
INTRAMUSCULAR | Status: AC
  Filled 2014-04-06: qty 10

## 2014-04-06 MED ORDER — CISATRACURIUM BESYLATE (PF) 10 MG/5ML IV SOLN
INTRAVENOUS | Status: DC | PRN
  Administered 2014-04-06: 12 mg via INTRAVENOUS
  Administered 2014-04-06 (×2): 4 mg via INTRAVENOUS

## 2014-04-06 MED ORDER — CEFAZOLIN SODIUM-DEXTROSE 2-3 GM-% IV SOLR
INTRAVENOUS | Status: AC
  Filled 2014-04-06: qty 50

## 2014-04-06 MED ORDER — BUPIVACAINE HCL (PF) 0.25 % IJ SOLN
INTRAMUSCULAR | Status: AC
  Filled 2014-04-06: qty 30

## 2014-04-06 MED ORDER — ONDANSETRON HCL 4 MG/2ML IJ SOLN
4.0000 mg | INTRAMUSCULAR | Status: DC | PRN
  Administered 2014-04-06 – 2014-04-09 (×7): 4 mg via INTRAVENOUS
  Filled 2014-04-06 (×8): qty 2

## 2014-04-06 MED ORDER — MIDAZOLAM HCL 2 MG/2ML IJ SOLN
INTRAMUSCULAR | Status: AC
Start: 2014-04-06 — End: 2014-04-06
  Filled 2014-04-06: qty 2

## 2014-04-06 MED ORDER — LACTATED RINGERS IV SOLN
INTRAVENOUS | Status: DC | PRN
  Administered 2014-04-06 (×3): via INTRAVENOUS

## 2014-04-06 MED ORDER — NALOXONE HCL 0.4 MG/ML IJ SOLN: 0.4000 mg | INTRAMUSCULAR | Status: DC | PRN

## 2014-04-06 MED ORDER — PROPOFOL 10 MG/ML IV BOLUS
INTRAVENOUS | Status: DC | PRN
  Administered 2014-04-06: 200 mg via INTRAVENOUS

## 2014-04-06 MED ORDER — DEXAMETHASONE SODIUM PHOSPHATE 10 MG/ML IJ SOLN
INTRAMUSCULAR | Status: DC | PRN
  Administered 2014-04-06: 10 mg via INTRAVENOUS

## 2014-04-06 MED ORDER — SODIUM CHLORIDE 0.9 % IJ SOLN
INTRAMUSCULAR | Status: AC
  Filled 2014-04-06: qty 50

## 2014-04-06 MED ORDER — SODIUM CHLORIDE 0.9 % IV SOLN
INTRAVENOUS | Status: DC
  Filled 2014-04-06 (×2): qty 20

## 2014-04-06 MED ORDER — LIDOCAINE HCL (CARDIAC) 20 MG/ML IV SOLN
INTRAVENOUS | Status: AC
  Filled 2014-04-06: qty 5

## 2014-04-06 MED ORDER — CISATRACURIUM BESYLATE 20 MG/10ML IV SOLN
INTRAVENOUS | Status: AC
  Filled 2014-04-06: qty 10

## 2014-04-06 MED ORDER — CEFAZOLIN SODIUM-DEXTROSE 2-3 GM-% IV SOLR
2.0000 g | INTRAVENOUS | Status: AC
  Administered 2014-04-06: 2 g via INTRAVENOUS

## 2014-04-06 MED ORDER — EPHEDRINE SULFATE 50 MG/ML IJ SOLN
INTRAMUSCULAR | Status: AC
  Filled 2014-04-06: qty 1

## 2014-04-06 MED ORDER — NEOSTIGMINE METHYLSULFATE 10 MG/10ML IV SOLN
INTRAVENOUS | Status: DC | PRN
  Administered 2014-04-06: 4 mg via INTRAVENOUS

## 2014-04-06 MED ORDER — DEXTROSE-NACL 5-0.45 % IV SOLN
INTRAVENOUS | Status: DC
  Administered 2014-04-06 – 2014-04-07 (×4): via INTRAVENOUS

## 2014-04-06 MED ORDER — STERILE WATER FOR IRRIGATION IR SOLN
Status: DC | PRN
  Administered 2014-04-06: 1500 mL

## 2014-04-06 MED ORDER — HYDROMORPHONE HCL 1 MG/ML IJ SOLN
0.2500 mg | INTRAMUSCULAR | Status: DC | PRN
  Administered 2014-04-06 (×3): 0.5 mg via INTRAVENOUS

## 2014-04-06 MED ORDER — BUPIVACAINE HCL (PF) 0.75 % IJ SOLN
INTRAMUSCULAR | Status: DC
  Administered 2014-04-06 – 2014-04-08 (×3): via EPIDURAL
  Filled 2014-04-06 (×6): qty 25

## 2014-04-06 MED ORDER — ATROPINE SULFATE 0.4 MG/ML IJ SOLN
INTRAMUSCULAR | Status: AC
  Filled 2014-04-06: qty 1

## 2014-04-06 MED ORDER — DEXAMETHASONE SODIUM PHOSPHATE 10 MG/ML IJ SOLN
INTRAMUSCULAR | Status: AC
  Filled 2014-04-06: qty 1

## 2014-04-06 MED ORDER — SODIUM CHLORIDE 0.9 % IV SOLN
INTRAVENOUS | Status: DC
  Administered 2014-04-06: 8 mL/h via EPIDURAL
  Filled 2014-04-06 (×3): qty 25

## 2014-04-06 MED ORDER — LACTATED RINGERS IV SOLN: INTRAVENOUS | Status: DC

## 2014-04-06 MED ORDER — MIDAZOLAM HCL 5 MG/5ML IJ SOLN
INTRAMUSCULAR | Status: DC | PRN
  Administered 2014-04-06 (×2): 1 mg via INTRAVENOUS

## 2014-04-06 MED ORDER — ALPRAZOLAM 0.5 MG PO TABS
0.5000 mg | ORAL_TABLET | Freq: Two times a day (BID) | ORAL | Status: DC | PRN
  Filled 2014-04-06 (×2): qty 1

## 2014-04-06 MED ORDER — HYDROMORPHONE 0.3 MG/ML IV SOLN
INTRAVENOUS | Status: DC
  Administered 2014-04-06: 1.19 mg via INTRAVENOUS
  Administered 2014-04-06: 2.3 mg via INTRAVENOUS
  Administered 2014-04-06: 22:00:00 via INTRAVENOUS
  Administered 2014-04-06: 3.79 mg via INTRAVENOUS
  Administered 2014-04-06: 10:00:00 via INTRAVENOUS
  Administered 2014-04-07: 1.5 mg via INTRAVENOUS
  Administered 2014-04-07: 0.999 mg via INTRAVENOUS
  Administered 2014-04-07: 16:00:00 via INTRAVENOUS
  Administered 2014-04-07: 2.7 mg via INTRAVENOUS
  Administered 2014-04-07: 3.39 mg via INTRAVENOUS
  Administered 2014-04-08: 2.46 mg via INTRAVENOUS
  Administered 2014-04-08: 05:00:00 via INTRAVENOUS
  Administered 2014-04-08: 2.9 mg via INTRAVENOUS
  Administered 2014-04-08: 5.1 mg via INTRAVENOUS
  Administered 2014-04-08: 1.59 mg via INTRAVENOUS
  Administered 2014-04-08: 1.19 mg via INTRAVENOUS
  Administered 2014-04-08: 1.25 mg via INTRAVENOUS
  Administered 2014-04-09: 1.39 mg via INTRAVENOUS
  Administered 2014-04-09: 0.2 mg via INTRAVENOUS
  Filled 2014-04-06 (×5): qty 25

## 2014-04-06 MED ORDER — PROPOFOL 10 MG/ML IV BOLUS
INTRAVENOUS | Status: AC
  Filled 2014-04-06: qty 20

## 2014-04-06 MED ORDER — GLYCOPYRROLATE 0.2 MG/ML IJ SOLN
INTRAMUSCULAR | Status: DC | PRN
  Administered 2014-04-06: .5 mg via INTRAVENOUS

## 2014-04-06 MED ORDER — DIPHENHYDRAMINE HCL 12.5 MG/5ML PO ELIX: 12.5000 mg | ORAL_SOLUTION | Freq: Four times a day (QID) | ORAL | Status: DC | PRN

## 2014-04-06 MED ORDER — 0.9 % SODIUM CHLORIDE (POUR BTL) OPTIME
TOPICAL | Status: DC | PRN
  Administered 2014-04-06: 1500 mL
  Administered 2014-04-06: 2000 mL

## 2014-04-06 MED ORDER — CEFAZOLIN SODIUM 1-5 GM-% IV SOLN
1.0000 g | Freq: Three times a day (TID) | INTRAVENOUS | Status: AC
  Administered 2014-04-06 (×2): 1 g via INTRAVENOUS
  Filled 2014-04-06 (×2): qty 50

## 2014-04-06 MED ORDER — GLYCOPYRROLATE 0.2 MG/ML IJ SOLN
INTRAMUSCULAR | Status: AC
  Filled 2014-04-06: qty 3

## 2014-04-06 MED ORDER — HYDROMORPHONE HCL 1 MG/ML IJ SOLN: 0.5000 mg | INTRAMUSCULAR | Status: DC | PRN

## 2014-04-06 MED ORDER — LIDOCAINE-EPINEPHRINE (PF) 2 %-1:200000 IJ SOLN
INTRAMUSCULAR | Status: AC
  Filled 2014-04-06: qty 20

## 2014-04-06 MED ORDER — HYDROMORPHONE 0.3 MG/ML IV SOLN
INTRAVENOUS | Status: AC
  Filled 2014-04-06: qty 25

## 2014-04-06 MED ORDER — ONDANSETRON HCL 4 MG/2ML IJ SOLN
INTRAMUSCULAR | Status: DC | PRN
  Administered 2014-04-06: 4 mg via INTRAVENOUS

## 2014-04-06 MED ORDER — LIDOCAINE HCL (PF) 2 % IJ SOLN
INTRAMUSCULAR | Status: DC | PRN
  Administered 2014-04-06: 75 mg via INTRADERMAL

## 2014-04-06 MED ORDER — LIDOCAINE-EPINEPHRINE (PF) 2 %-1:200000 IJ SOLN
INTRAMUSCULAR | Status: DC | PRN
  Administered 2014-04-06: 5 mL
  Administered 2014-04-06: 6 mL
  Administered 2014-04-06: 4 mL

## 2014-04-06 MED ORDER — DOCUSATE SODIUM 100 MG PO CAPS
100.0000 mg | ORAL_CAPSULE | Freq: Two times a day (BID) | ORAL | Status: DC
Start: 2014-04-06 — End: 2014-04-10

## 2014-04-06 MED ORDER — FENTANYL CITRATE 0.05 MG/ML IJ SOLN
INTRAMUSCULAR | Status: AC
  Filled 2014-04-06: qty 2

## 2014-04-06 MED ORDER — SUFENTANIL CITRATE 50 MCG/ML IV SOLN
INTRAVENOUS | Status: DC | PRN
  Administered 2014-04-06: 5 ug via INTRAVENOUS
  Administered 2014-04-06 (×2): 10 ug via INTRAVENOUS
  Administered 2014-04-06: 5 ug via INTRAVENOUS
  Administered 2014-04-06 (×2): 10 ug via INTRAVENOUS

## 2014-04-06 MED ORDER — BUPIVACAINE HCL (PF) 0.25 % IJ SOLN
INTRAMUSCULAR | Status: DC | PRN
  Administered 2014-04-06: 6 mL

## 2014-04-06 MED ORDER — PROMETHAZINE HCL 25 MG/ML IJ SOLN: 6.2500 mg | INTRAMUSCULAR | Status: DC | PRN

## 2014-04-06 MED ORDER — ACETAMINOPHEN 10 MG/ML IV SOLN
1000.0000 mg | Freq: Four times a day (QID) | INTRAVENOUS | Status: AC
  Administered 2014-04-06 – 2014-04-07 (×4): 1000 mg via INTRAVENOUS
  Filled 2014-04-06 (×4): qty 100

## 2014-04-06 MED ORDER — HYDROMORPHONE HCL 1 MG/ML IJ SOLN
INTRAMUSCULAR | Status: AC
  Filled 2014-04-06: qty 1

## 2014-04-06 MED ORDER — BUPIVACAINE LIPOSOME 1.3 % IJ SUSP
20.0000 mL | Freq: Once | INTRAMUSCULAR | Status: AC
  Administered 2014-04-06: 20 mL
  Filled 2014-04-06: qty 20

## 2014-04-06 MED ORDER — SUFENTANIL CITRATE 50 MCG/ML IV SOLN
INTRAVENOUS | Status: AC
  Filled 2014-04-06: qty 1

## 2014-04-06 MED ORDER — FENTANYL CITRATE 0.05 MG/ML IJ SOLN
INTRAMUSCULAR | Status: DC | PRN
  Administered 2014-04-06 (×2): 50 ug via INTRAVENOUS

## 2014-04-06 MED ORDER — DOCUSATE SODIUM 100 MG PO CAPS
100.0000 mg | ORAL_CAPSULE | Freq: Two times a day (BID) | ORAL | Status: DC
  Administered 2014-04-06 – 2014-04-09 (×7): 100 mg via ORAL
  Filled 2014-04-06 (×8): qty 1

## 2014-04-06 MED ORDER — ONDANSETRON HCL 4 MG/2ML IJ SOLN
4.0000 mg | Freq: Four times a day (QID) | INTRAMUSCULAR | Status: DC | PRN
Start: 2014-04-06 — End: 2014-04-06

## 2014-04-06 SURGICAL SUPPLY — 58 items
ATTRACTOMAT 16X20 MAGNETIC DRP (DRAPES) ×1 IMPLANT
BLADE EXTENDED COATED 6.5IN (ELECTRODE) ×2 IMPLANT
BLADE HEX COATED 2.75 (ELECTRODE) ×1 IMPLANT
CANISTER SUCTION 2500CC (MISCELLANEOUS) ×1 IMPLANT
CATH FOLEY 2WAY SLVR  5CC 16FR (CATHETERS) ×1
CATH FOLEY 2WAY SLVR 5CC 16FR (CATHETERS) ×1 IMPLANT
CHLORAPREP W/TINT 26ML (MISCELLANEOUS) ×2 IMPLANT
CLIP LIGATING HEM O LOK PURPLE (MISCELLANEOUS) ×8 IMPLANT
CLIP LIGATING HEMO O LOK GREEN (MISCELLANEOUS) ×5 IMPLANT
COVER SURGICAL LIGHT HANDLE (MISCELLANEOUS) ×2 IMPLANT
DECANTER SPIKE VIAL GLASS SM (MISCELLANEOUS) IMPLANT
DISSECTOR ROUND CHERRY 3/8 STR (MISCELLANEOUS) ×1 IMPLANT
DRAIN CHANNEL 15F RND FF 3/16 (WOUND CARE) ×1 IMPLANT
DRAIN PENROSE 18X1/2 LTX STRL (DRAIN) IMPLANT
DRAPE INCISE IOBAN 66X45 STRL (DRAPES) ×2 IMPLANT
DRAPE LAPAROSCOPIC ABDOMINAL (DRAPES) ×1 IMPLANT
DRAPE LAPAROTOMY TRNSV 102X78 (DRAPE) IMPLANT
DRAPE TABLE BACK 44X90 PK DISP (DRAPES) ×1 IMPLANT
DRAPE WARM FLUID 44X44 (DRAPE) ×2 IMPLANT
ELECT REM PT RETURN 9FT ADLT (ELECTROSURGICAL) ×2
ELECTRODE REM PT RTRN 9FT ADLT (ELECTROSURGICAL) ×1 IMPLANT
EVACUATOR SILICONE 100CC (DRAIN) ×1 IMPLANT
GAUZE SPONGE 4X4 12PLY STRL (GAUZE/BANDAGES/DRESSINGS) ×3 IMPLANT
GAUZE SPONGE 4X4 16PLY XRAY LF (GAUZE/BANDAGES/DRESSINGS) ×2 IMPLANT
GLOVE BIOGEL M STRL SZ7.5 (GLOVE) ×3 IMPLANT
HEMOSTAT SURGICEL 4X8 (HEMOSTASIS) IMPLANT
KIT BASIN OR (CUSTOM PROCEDURE TRAY) ×2 IMPLANT
LIGASURE IMPACT 36 18CM CVD LR (INSTRUMENTS) ×1 IMPLANT
LOOP VESSEL MAXI BLUE (MISCELLANEOUS) ×2 IMPLANT
MANIFOLD NEPTUNE II (INSTRUMENTS) ×1 IMPLANT
NS IRRIG 1000ML POUR BTL (IV SOLUTION) ×1 IMPLANT
PACK GENERAL/GYN (CUSTOM PROCEDURE TRAY) ×2 IMPLANT
POSITIONER SURGICAL ARM (MISCELLANEOUS) ×3 IMPLANT
SPONGE LAP 18X18 X RAY DECT (DISPOSABLE) ×3 IMPLANT
SPONGE SURGIFOAM ABS GEL 100 (HEMOSTASIS) IMPLANT
STAPLER VISISTAT 35W (STAPLE) ×2 IMPLANT
SURGIFLO W/THROMBIN 8M KIT (HEMOSTASIS) ×1 IMPLANT
SUT ETHILON 3 0 PS 1 (SUTURE) IMPLANT
SUT MON AB 2-0 SH 27 (SUTURE) ×2 IMPLANT
SUT MON AB 2-0 SH27 (SUTURE) ×2 IMPLANT
SUT PDS AB 1 TP1 96 (SUTURE) ×4 IMPLANT
SUT SILK 0 (SUTURE) ×2
SUT SILK 0 30XBRD TIE 6 (SUTURE) ×1 IMPLANT
SUT SILK 2 0 (SUTURE) ×2
SUT SILK 2-0 30XBRD TIE 12 (SUTURE) ×1 IMPLANT
SUT VIC AB 2-0 SH 27 (SUTURE) ×2
SUT VIC AB 2-0 SH 27X BRD (SUTURE) ×4 IMPLANT
SUT VIC AB 4-0 RB1 27 (SUTURE)
SUT VIC AB 4-0 RB1 27XBRD (SUTURE) ×4 IMPLANT
SYR 20CC LL (SYRINGE) ×1 IMPLANT
SYRINGE 20CC LL (MISCELLANEOUS) ×1 IMPLANT
TAPE CLOTH SURG 4X10 WHT LF (GAUZE/BANDAGES/DRESSINGS) ×1 IMPLANT
TAPE UMBILICAL COTTON 1/8X30 (MISCELLANEOUS) ×1 IMPLANT
TOWEL OR 17X26 10 PK STRL BLUE (TOWEL DISPOSABLE) ×4 IMPLANT
TOWEL OR NON WOVEN STRL DISP B (DISPOSABLE) ×2 IMPLANT
TUBING CONNECTING 10 (TUBING) ×1 IMPLANT
URINEMETER 200ML W/220 (MISCELLANEOUS) ×1 IMPLANT
WATER STERILE IRR 1500ML POUR (IV SOLUTION) ×1 IMPLANT

## 2014-04-06 NOTE — Anesthesia Procedure Notes (Signed)
Epidural Patient location during procedure: holding area Start time: 04/06/2014 6:45 AM  Staffing Anesthesiologist: Salley Scarlet Performed by: anesthesiologist   Preanesthetic Checklist Completed: patient identified, site marked, surgical consent, pre-op evaluation, timeout performed, IV checked, risks and benefits discussed, monitors and equipment checked and post-op pain management  Epidural Patient position: sitting Prep: Betadine Patient monitoring: heart rate, continuous pulse ox, blood pressure and cardiac monitor Approach: midline Location: thoracic (1-12) Injection technique: LOR air and LOR saline  Needle:  Needle type: Hustead  Needle gauge: 18 G Needle length: 9 cm and 9 Needle insertion depth: 4 cm Catheter type: closed end flexible Catheter size: 20 Guage Catheter at skin depth: 7 cm Test dose: negative and 1.5% lidocaine with Epi 1:200 K  Assessment Events: blood not aspirated  Additional Notes Test dose 1.5% Lidocaine with epi 1:200,000  Patient tolerated the insertion well without complications. Meaningful verbal contact maintained throughout procedure. Loss of sensation to ice on left side.Reason for block:post-op pain management

## 2014-04-06 NOTE — Progress Notes (Signed)
Patient ID: Jackie Mcconnell, female   DOB: 08-21-1970, 43 y.o.   MRN: [id number]  Post-op note  Subjective: The patient is doing well.  No complaints.  Objective: Vital signs in last 24 hours: Temp:  [97.7 F (36.5 C)-98.3 F (36.8 C)] 98.3 F (36.8 C) (10/12 1145) Pulse Rate:  [56-94] 71 (10/12 1145) Resp:  [11-18] 12 (10/12 1700) BP: (88-132)/(43-106) 132/78 mmHg (10/12 1700) SpO2:  [95 %-100 %] 99 % (10/12 1700) Weight:  [101.606 kg (224 lb)] 101.606 kg (224 lb) (10/12 0544)  Intake/Output from previous day:   Intake/Output this shift:    Physical Exam:  General: Alert and oriented. Abdomen: Soft, Nondistended. Incisions: Clean and dry.  Lab Results:  Recent Labs  04/06/14 1025  HGB 13.5  HCT 39.7    Assessment/Plan: POD#0   1) Continue to monitor   Pryor Curia. MD   LOS: 0 days   Lindberg Zenon,LES 04/06/2014, 9:18 PM

## 2014-04-06 NOTE — Discharge Instructions (Signed)

## 2014-04-06 NOTE — Anesthesia Postprocedure Evaluation (Signed)
  Anesthesia Post-op Note  Patient: Jackie Mcconnell  Procedure(s) Performed: Procedure(s) (LRB): NEPHRECTOMY (Left)  Patient Location: PACU  Anesthesia Type: general with epidural for postop. Pain control  Level of Consciousness: awake and alert   Airway and Oxygen Therapy: Patient Spontanous Breathing  Post-op Pain: mild  Post-op Assessment: Post-op Vital signs reviewed, Patient's Cardiovascular Status Stable, Respiratory Function Stable, Patent Airway and No signs of Nausea or vomiting  Last Vitals:  Filed Vitals:   04/06/14 1300  BP: 89/63  Pulse:   Temp:   Resp:     Post-op Vital Signs: stable   Complications: No apparent anesthesia complications

## 2014-04-06 NOTE — Transfer of Care (Signed)
Immediate Anesthesia Transfer of Care Note  Patient: Jackie Mcconnell  Procedure(s) Performed: Procedure(s): NEPHRECTOMY (Left)  Patient Location: PACU  Anesthesia Type:General, Epidural and epidural for post op pain.  Level of Consciousness: awake, sedated, patient cooperative and responds to stimulation  Airway & Oxygen Therapy: Patient Spontanous Breathing and Patient connected to face mask oxygen  Post-op Assessment: Report given to PACU RN and Post -op Vital signs reviewed and stable  Post vital signs: Reviewed and stable  Complications: No apparent anesthesia complications

## 2014-04-06 NOTE — Progress Notes (Signed)
S: Sitting up in bed. Currently nauseated, which she attributes to recent popsicle ingestion. Pain has been between 3 and 6 out of 10 . Using IV PCA. States right leg more numb than left. Overall, doing pretty well. Her wife is at bedside.  O: 132/78   A/P: Doing pretty well. Plan to increase epidural rate to 10 cc/hour. Reminder not to get up without assistance.

## 2014-04-06 NOTE — Op Note (Signed)
Preoperative diagnosis:  1. Left cystic renal mass  Postoperative diagnosis: 1. Same  Procedure(s): 1. Left opent radical nephrectomy  Surgeon: Dr. Roxy Horseman, Brooke Bonito  Resident: Dr. Milon Score  Anesthesia: General  Complications: None  EBL: 245mL  IVF: 2.8L crystalloid  Specimens: Left kidney  Disposition of specimens: Pathology  Intraoperative findings: Large cystic left renal mass. Single renal vein with artery posterior to the vein. Small upper pole arterial branch. All vessels clipped and cut. No significant bleeding at the end of the case. Kidney was removed en bloc. Adrenal was spared.  Indication: Large left renal mass causing discomfort. Likely benign multilocular cystic nephroma.   Description of procedure:  The patient was verified and the appropriate side was marked in pre-operative holding. She was brought to the OR and placed supine on the OR table. SCDs were placed and IV Ancef was initiated. General anesthesia was induced and the patient was flexed slightly with a small bump under the left flank. A foley was placed. She was then prepped and draped in the usual sterile fashion. Pre-procedure time out was called.  An anterior subcostal incision about 2cm below the costal margin was made from the xiphoid to the tip of the 12th rib. Bovie cautery was used to incised down through the subcutaneous tissues. The external oblique, internal oblique and transveralis were sequentially incised with the Bovie maintaining hemostasis. The peritoneum was encountered and incised with the Metzenbaum scissors and subsequently opened the entire length of the incision. The mass was encountered with a small amount of colon anterior to the mass. The white line of Toldt was incised using the Bovie to reflect the colon medially. Retractors were placed to expose the mass and the medial structures.  The ureter and gonadal were immediately encountered. Both of these structures were  clipped and incised. The kidney was mobilized by bluntly developing the lateral and posterior planes. The superior attachments were then incised with the Bovie and the Ligasure until the kidney was freely mobile around the hilum. The vein was identified anteriorly and the renal artery was palpable just posterior to the vein. The right angle instrument was used to dissect these structures. Several clips were used to control the renal artery and this was cut sharply. The vein was similarly clipped and cut. The remaining attachments were ligated using the Ligasure and the kidney was removed. The kidney bed was hemostatic after irrigation. Local anesthetic was injected into the deep muscular layers and closed in 2 layers with running 0-looped PDS. Additional local anesthetic was injected and the skin was stapled closed. She was awoken from general anesthesia having tolerated the procedure well.   Dr. Alinda Money was present and scrubbed for the entire procedure.

## 2014-04-06 NOTE — Interval H&P Note (Signed)
History and Physical Interval Note:  04/06/2014 7:15 AM  Jackie Mcconnell  has presented today for surgery, with the diagnosis of LEFT RENAL MASS  The various methods of treatment have been discussed with the patient and family. After consideration of risks, benefits and other options for treatment, the patient has consented to  Procedure(s): NEPHRECTOMY (Left) as a surgical intervention .  The patient's history has been reviewed, patient examined, no change in status, stable for surgery.  I have reviewed the patient's chart and labs.  Questions were answered to the patient's satisfaction.     Alyaan Budzynski,LES

## 2014-04-07 ENCOUNTER — Encounter (HOSPITAL_COMMUNITY): Payer: Self-pay | Admitting: Urology

## 2014-04-07 LAB — BASIC METABOLIC PANEL
ANION GAP: 11 (ref 5–15)
BUN: 5 mg/dL — ABNORMAL LOW (ref 6–23)
CALCIUM: 8.5 mg/dL (ref 8.4–10.5)
CO2: 24 meq/L (ref 19–32)
Chloride: 106 mEq/L (ref 96–112)
Creatinine, Ser: 0.85 mg/dL (ref 0.50–1.10)
GFR calc Af Amer: >90 mL/min (ref >90)
GFR calc non Af Amer: 83 mL/min — ABNORMAL LOW (ref >90)
Glucose, Bld: 111 mg/dL — ABNORMAL HIGH (ref 70–99)
POTASSIUM: 3.7 meq/L (ref 3.7–5.3)
SODIUM: 141 meq/L (ref 137–147)

## 2014-04-07 LAB — HEMOGLOBIN AND HEMATOCRIT, BLOOD
HCT: 37.3 % (ref 36.0–46.0)
HEMOGLOBIN: 12.8 g/dL (ref 12.0–15.0)

## 2014-04-07 MED ORDER — OXYCODONE-ACETAMINOPHEN 5-325 MG PO TABS
1.0000 | ORAL_TABLET | ORAL | Status: DC | PRN
  Administered 2014-04-07 – 2014-04-10 (×16): 2 via ORAL
  Filled 2014-04-07 (×16): qty 2

## 2014-04-07 MED ORDER — SODIUM CHLORIDE 0.9 % IV SOLN: INTRAVENOUS | Status: DC

## 2014-04-07 MED ORDER — ACETAMINOPHEN 10 MG/ML IV SOLN
1000.0000 mg | Freq: Four times a day (QID) | INTRAVENOUS | Status: DC
  Administered 2014-04-07: 1000 mg via INTRAVENOUS
  Filled 2014-04-07 (×3): qty 100

## 2014-04-07 MED ORDER — ESTRADIOL 1 MG PO TABS
0.5000 mg | ORAL_TABLET | Freq: Every day | ORAL | Status: DC
  Administered 2014-04-07 – 2014-04-09 (×3): 0.5 mg via ORAL
  Filled 2014-04-07 (×4): qty 0.5

## 2014-04-07 MED ORDER — BISACODYL 10 MG RE SUPP
10.0000 mg | Freq: Once | RECTAL | Status: DC
  Filled 2014-04-07: qty 1

## 2014-04-07 NOTE — Progress Notes (Signed)
Patient ID: Jackie Mcconnell, female   DOB: 11-Dec-1970, 43 y.o.   MRN: [id number]  1 Day Post-Op Subjective: Pt doing well.  Pain relatively well controlled with epidural and additional narcotic via PCA.  Pt with some right lower extremity numbness but she did feel fairly steady on her feet when ambulating and getting to chair.  Minimal nausea overnight.   Objective: Vital signs in last 24 hours: Temp:  [97.8 F (36.6 C)-98.6 F (37 C)] 98.3 F (36.8 C) (10/13 0413) Pulse Rate:  [60-86] 81 (10/13 0413) Resp:  [11-18] 13 (10/13 0413) BP: (88-132)/(43-106) 107/66 mmHg (10/13 0413) SpO2:  [95 %-100 %] 97 % (10/13 0413)  Intake/Output from previous day: 10/12 0701 - 10/13 0700 In: 6970 [P.O.:720; I.V.:6000; IV Piggyback:250] Out: 2633 [Urine:4030; Blood:200] Intake/Output this shift:    Physical Exam:  General: Alert and oriented CV: RRR Lungs: Clear Abdomen: Soft, ND, positive BS Incisions: Dressing intact with minimal drainage Ext: NT, No erythema  Lab Results:  Recent Labs  04/06/14 1025 04/07/14 0430  HGB 13.5 12.8  HCT 39.7 37.3   BMET  Recent Labs  04/06/14 1025 04/07/14 0430  NA 143 141  K 3.8 3.7  CL 107 106  CO2 25 24  GLUCOSE 137* 111*  BUN 6 5*  CREATININE 1.09 0.85  CALCIUM 8.5 8.5     Studies/Results: No results found.  Assessment/Plan: POD #1 s/p left open radical nephrectomy - Ambulate with assistance, IS - DVT prophylaxis (will hold pharmacologic prophylaxis while epidural is in place) - Advance diet as tolerated - D/C Foley and monitor for urinary retention with epidural in place - Will begin transition to po pain meds once tolerating po intake, will likely benefit from epidural for another 24-48 hrs - Dulcolax suppository   LOS: 1 day   Augusta Hilbert,LES 04/07/2014, 7:29 AM

## 2014-04-07 NOTE — Progress Notes (Signed)
Came to visit patient on behalf of [employer] to [employer] program for [employer] employees/dependents with [employer]. Discussed [facility] to Wellness for HTN. States she follow up with the nutritionist and is follows up on her HTN. Appreciative of visit however. Left brochure and contact information if she should become interested in program in the future.  [facility] [phone]

## 2014-04-07 NOTE — Care Management Note (Addendum)
    Page 1 of 1   04/10/2014     11:25:01 AM CARE MANAGEMENT NOTE 04/10/2014  Patient:  JEANELLE, DAKE A   Account Number:  [id number]  Date Initiated:  04/07/2014  Documentation initiated by:  [facility]  Subjective/Objective Assessment:   43 Y/O F ADMITTED W/RENAL NEOPLASM.     Action/Plan:   FROM HOME.   Anticipated DC Date:  04/10/2014   Anticipated DC Plan:  [place]  CM consult      Choice offered to / List presented to:             Status of service:  Completed, signed off Medicare Important Message given?   (If response is "NO", the following Medicare IM given date fields will be blank) Date Medicare IM given:   Medicare IM given by:   Date Additional Medicare IM given:   Additional Medicare IM given by:    Discharge Disposition:  HOME/SELF CARE  Per UR Regulation:  Reviewed for med. necessity/level of care/duration of stay  If discussed at [place] of Stay Meetings, dates discussed:    Comments:  04/10/14 Enolia Koepke RN,BSN NCM [phone] D/C HOME NO [place].  04/07/14 Lashaunta Sicard RN,BSN NCM [phone] POD#1 L RAD NEPHRECTOMY.NO ANTICIPATED D/C NEEDS.

## 2014-04-07 NOTE — Progress Notes (Signed)
Pt unable to stand up from the chair, both her legs feel  numb & weak esp. Her right leg. Pt not ready for Foley to come out. Dr. Alinda Money aware.

## 2014-04-07 NOTE — Progress Notes (Signed)
Patient ID: Jackie Mcconnell, female   DOB: 11-01-1970, 43 y.o.   MRN: [id number]  1 Day Post-Op Subjective: Pt doing well this afternoon. Epidural decreased today due to low blood pressure and weakness primarily in right leg. Pain is controlled. Using PCA every 30 minutes or so. Passing gas and tolerating bland solid food without nausea. Requesting foley stay in. Requesting PO pain meds to try to wean off PCA.   Objective: Vital signs in last 24 hours: Temp:  [98.1 F (36.7 C)-98.6 F (37 C)] 98.1 F (36.7 C) (10/13 1402) Pulse Rate:  [66-86] 74 (10/13 1402) Resp:  [13-19] 19 (10/13 1402) BP: (102-107)/(53-66) 107/53 mmHg (10/13 1402) SpO2:  [97 %-100 %] 100 % (10/13 1402)  Intake/Output from previous day: 10/12 0701 - 10/13 0700 In: 6970 [P.O.:720; I.V.:6000; IV Piggyback:250] Out: 7209 [Urine:4030; Blood:200] Intake/Output this shift: Total I/O In: -  Out: 650 [Urine:650]  Physical Exam:  General: Alert and oriented CV: RRR Lungs: Clear Abdomen: Soft, ND, positive BS Incisions: Dressing intact with minimal drainage Ext: NT, No erythema Foley: clear yellow  Lab Results:  Recent Labs  04/06/14 1025 04/07/14 0430  HGB 13.5 12.8  HCT 39.7 37.3   BMET  Recent Labs  04/06/14 1025 04/07/14 0430  NA 143 141  K 3.8 3.7  CL 107 106  CO2 25 24  GLUCOSE 137* 111*  BUN 6 5*  CREATININE 1.09 0.85  CALCIUM 8.5 8.5     Studies/Results: No results found.  Assessment/Plan: POD #1 s/p left open radical nephrectomy. Doing well. - Ambulate with assistance, IS - DVT prophylaxis (will hold pharmacologic prophylaxis while epidural is in place) - Advance diet as tolerated - D/C Foley tomorrow given difficulty mobilizing with epidural in place - Transition to po pain meds this evening, will likely benefit from epidural for another 24-48 hrs - Resume estradiol per patient request for hot flashes    LOS: 1 day   [place], Jatia Musa C 04/07/2014, 5:11 PM

## 2014-04-08 LAB — BASIC METABOLIC PANEL
Anion gap: 10 (ref 5–15)
BUN: 4 mg/dL — AB (ref 6–23)
CO2: 25 mEq/L (ref 19–32)
CREATININE: 0.97 mg/dL (ref 0.50–1.10)
Calcium: 8.5 mg/dL (ref 8.4–10.5)
Chloride: 108 mEq/L (ref 96–112)
GFR calc non Af Amer: 71 mL/min — ABNORMAL LOW (ref >90)
GFR, EST AFRICAN AMERICAN: 82 mL/min — AB (ref >90)
Glucose, Bld: 99 mg/dL (ref 70–99)
POTASSIUM: 3.6 meq/L — AB (ref 3.7–5.3)
Sodium: 143 mEq/L (ref 137–147)

## 2014-04-08 MED ORDER — HYDROMORPHONE HCL 1 MG/ML IJ SOLN: 0.5000 mg | INTRAMUSCULAR | Status: DC | PRN

## 2014-04-08 NOTE — Progress Notes (Signed)
Patient ID: Jackie Mcconnell, female   DOB: 08-14-1970, 43 y.o.   MRN: [id number]  2 Days Post-Op Subjective: Pt doing well.  No nausea or vomiting.  Continuing to pass flatus.  Epidural rate turned down yesterday afternoon.  Lower extremities remained numb and weak last night but she was able to stand with good strength this morning at current rate.  Pain control remains good with epidural/Percocet/PCA for breakthrough (currently using PCA about every 20 min).  Objective: Vital signs in last 24 hours: Temp:  [97.7 F (36.5 C)-99 F (37.2 C)] 99 F (37.2 C) (10/14 0700) Pulse Rate:  [64-93] 93 (10/14 0700) Resp:  [16-27] 18 (10/14 0700) BP: (98-114)/(51-60) 114/60 mmHg (10/14 0700) SpO2:  [97 %-100 %] 98 % (10/14 0700)  Intake/Output from previous day: 10/13 0701 - 10/14 0700 In: 1881.3 [I.V.:1881.3] Out: 2600 [Urine:2600] Intake/Output this shift:    Physical Exam:  General: Alert and oriented Abdomen: Soft, ND, ND, Positive BS Incisions: C/D/I Ext: NT, No erythema  Lab Results:  Recent Labs  04/06/14 1025 04/07/14 0430  HGB 13.5 12.8  HCT 39.7 37.3   BMET  Recent Labs  04/07/14 0430 04/08/14 0500  NA 141 143  K 3.7 3.6*  CL 106 108  CO2 24 25  GLUCOSE 111* 99  BUN 5* 4*  CREATININE 0.85 0.97  CALCIUM 8.5 8.5     Studies/Results: Path: Benign multilocular cystic nephroma  Assessment/Plan: POD # 2 s/p left radical nephrectomy - Path results reviewed with patient - Ambulate today.  If she is able to ambulate well, would keep epidural for one more day.  If she cannot ambulate well, will need to either further decrease rate or remove epidural to allow for ambulation. - Continue po pain meds with plans to see if we can possible D/C PCA and use prn IV meds by later today possibly. - Advance diet - D/C Foley    LOS: 2 days   Bernie Ransford,LES 04/08/2014, 7:07 AM

## 2014-04-08 NOTE — Progress Notes (Signed)
Pt unable to ambulate tonight due to numbness/decreased sensation from epidural drip.

## 2014-04-08 NOTE — Progress Notes (Signed)
Patient ID: Jackie Mcconnell, female   DOB: Oct 28, 1970, 43 y.o.   MRN: [id number]  2 Days Post-Op Subjective: Patient reports epidural is likely out because they noticed the sheets were soaked and there was fluid running down her back when she went to the bathroom. Pain is reasonably well controlled today with PO percocet and PCA breakthrough though she reports it is slightly increased. No issues ambulating today. Foley removed around 3pm so no urge to void yet. Tolerating regular food. Mild nausea when took percocet on a relatively empty stomach but otherwise no issues with PO intake.   Objective: Vital signs in last 24 hours: Temp:  [97.7 F (36.5 C)-99 F (37.2 C)] 98.2 F (36.8 C) (10/14 1421) Pulse Rate:  [64-93] 76 (10/14 1421) Resp:  [16-27] 20 (10/14 1421) BP: (98-127)/(51-61) 127/61 mmHg (10/14 1421) SpO2:  [97 %-99 %] 99 % (10/14 1421)  Intake/Output from previous day: 10/13 0701 - 10/14 0700 In: 1881.3 [I.V.:1881.3] Out: 2600 [Urine:2600] Intake/Output this shift:    Physical Exam:  General: Alert and oriented Abdomen: Soft, ND, ND, Positive BS Incisions: C/D/I Ext: NT, No erythema  Lab Results:  Recent Labs  04/06/14 1025 04/07/14 0430  HGB 13.5 12.8  HCT 39.7 37.3   BMET  Recent Labs  04/07/14 0430 04/08/14 0500  NA 141 143  K 3.7 3.6*  CL 106 108  CO2 24 25  GLUCOSE 111* 99  BUN 5* 4*  CREATININE 0.85 0.97  CALCIUM 8.5 8.5     Studies/Results: Path: Benign multilocular cystic nephroma  Assessment/Plan: POD # 2 s/p left radical nephrectomy - Path results reviewed with patient - Did well with ambulation - Epidural likely displaced. Pain reasonably well controlled at this point, but would like her PCA increased slightly overnight as she is concerned pain will be worse without epidural.  - Foley removed today, awaiting trial of void - Regular diet and medlocked  D/C when pain controlled on PO meds   LOS: 2 days   Kierstan Auer  C 04/08/2014, 4:04 PM

## 2014-04-08 NOTE — Progress Notes (Signed)
Epidural note Called by RN stating the epidural was leaking. Examined site, epidural migrated out. Pain control adequate. Catheter removed. Tip intact. No issues.

## 2014-04-09 MED ORDER — HYDROMORPHONE HCL 1 MG/ML IJ SOLN: 0.5000 mg | INTRAMUSCULAR | Status: DC | PRN

## 2014-04-09 MED ORDER — MAGIC MOUTHWASH
10.0000 mL | Freq: Three times a day (TID) | ORAL | Status: DC | PRN
  Administered 2014-04-09: 10 mL via ORAL
  Filled 2014-04-09 (×2): qty 10

## 2014-04-09 NOTE — Progress Notes (Signed)
Patient ID: Jackie Mcconnell, female   DOB: March 11, 1971, 43 y.o.   MRN: [id number]  3 Days Post-Op Subjective: Improved this am. Pain controlled overnight. Ambulated more yesterday, including in the hall. Voided without difficulty. Continued intermittent mild nausea with PO pain pills. Overall doing well.   Objective: Vital signs in last 24 hours: Temp:  [98.2 F (36.8 C)-99 F (37.2 C)] 98.6 F (37 C) (10/15 0559) Pulse Rate:  [74-93] 74 (10/15 0559) Resp:  [16-20] 18 (10/15 0559) BP: (109-127)/(57-66) 113/66 mmHg (10/15 0559) SpO2:  [95 %-100 %] 96 % (10/15 0559)  Intake/Output from previous day: 10/14 0701 - 10/15 0700 In: 300 [P.O.:300] Out: 900 [Urine:900] Intake/Output this shift: Total I/O In: 300 [P.O.:300] Out: 700 [Urine:700]  Physical Exam:  General: Alert and oriented Abdomen: Soft, ND, ND, Positive BS Incisions: C/D/I Ext: NT, No erythema  Lab Results:  Recent Labs  04/06/14 1025 04/07/14 0430  HGB 13.5 12.8  HCT 39.7 37.3   BMET  Recent Labs  04/07/14 0430 04/08/14 0500  NA 141 143  K 3.7 3.6*  CL 106 108  CO2 24 25  GLUCOSE 111* 99  BUN 5* 4*  CREATININE 0.85 0.97  CALCIUM 8.5 8.5     Studies/Results: Path: Benign multilocular cystic nephroma  Assessment/Plan: POD # 3 s/p left radical nephrectomy - Path results reviewed with patient - Ambulating and voiding well. - D/C PCA today, epidural off since last night. Continue PO pain meds with IV for breakthrough. Will attempt to minimize IV pain meds in hopes of going home this afternoon or tomorrow - Tolerating regular diet, medlocked - Ambulatory and SDCs for DVT prophylaxis. Patient is low risk.  D/C when pain controlled on PO meds   LOS: 3 days   Irine Heminger C 04/09/2014, 6:36 AM

## 2014-04-09 NOTE — Progress Notes (Signed)
Patient ID: Jackie Mcconnell, female   DOB: 12-09-1970, 43 y.o.   MRN: [id number]  3 Days Post-Op Subjective: Doing very well. Ambulated without assistance. PO pain meds only and pain controlled. No significant nausea. Tolerating good PO. Voiding well and passing frequent flatus. Took zofran only once this am.   Objective: Vital signs in last 24 hours: Temp:  [98.6 F (37 C)-98.7 F (37.1 C)] 98.7 F (37.1 C) (10/15 1455) Pulse Rate:  [74-80] 80 (10/15 1455) Resp:  [16-18] 18 (10/15 1455) BP: (109-118)/(57-84) 118/84 mmHg (10/15 1455) SpO2:  [95 %-100 %] 99 % (10/15 1455)  Intake/Output from previous day: 10/14 0701 - 10/15 0700 In: 300 [P.O.:300] Out: 900 [Urine:900] Intake/Output this shift: Total I/O In: -  Out: 1150 [Urine:1150]  Physical Exam:  General: Alert and oriented Abdomen: Soft, ND, ND, Positive BS Incisions: C/D/I Ext: NT, No erythema  Lab Results:  Recent Labs  04/07/14 0430  HGB 12.8  HCT 37.3   BMET  Recent Labs  04/07/14 0430 04/08/14 0500  NA 141 143  K 3.7 3.6*  CL 106 108  CO2 24 25  GLUCOSE 111* 99  BUN 5* 4*  CREATININE 0.85 0.97  CALCIUM 8.5 8.5     Studies/Results: Path: Benign multilocular cystic nephroma  Assessment/Plan: POD # 3 s/p left radical nephrectomy - Path results reviewed with patient - Ambulating and voiding well. - Pain controlled on PO pain meds only - Tolerating regular diet, medlocked - Ambulatory and SDCs for DVT prophylaxis. Patient is low risk.  D/C tomorrow morning    LOS: 3 days   Jackie Mcconnell C 04/09/2014, 5:49 PM

## 2014-04-10 ENCOUNTER — Other Ambulatory Visit: Payer: Self-pay

## 2014-04-10 MED ORDER — ONDANSETRON HCL 4 MG PO TABS: 4.0000 mg | ORAL_TABLET | Freq: Three times a day (TID) | ORAL | Status: DC | PRN

## 2014-04-10 MED ORDER — DOCUSATE SODIUM 100 MG PO CAPS: 100.0000 mg | ORAL_CAPSULE | Freq: Two times a day (BID) | ORAL | Status: AC

## 2014-04-10 MED ORDER — MAGIC MOUTHWASH: 10.0000 mL | Freq: Three times a day (TID) | ORAL | Status: DC | PRN

## 2014-04-10 MED ORDER — OXYCODONE-ACETAMINOPHEN 5-325 MG PO TABS: 1.0000 | ORAL_TABLET | ORAL | Status: DC | PRN

## 2014-04-10 NOTE — Progress Notes (Signed)
Patient ID: Jackie Mcconnell, female   DOB: Apr 10, 1971, 43 y.o.   MRN: [id number]  4 Days Post-Op Subjective: Pt doing well this morning.  Pain is well controlled on oral pain medication. Tolerating diet and ambulating well.  Objective: Vital signs in last 24 hours: Temp:  [98.3 F (36.8 C)-98.7 F (37.1 C)] 98.3 F (36.8 C) (10/16 0545) Pulse Rate:  [70-86] 70 (10/16 0545) Resp:  [18] 18 (10/16 0545) BP: (114-118)/(70-84) 118/84 mmHg (10/16 0545) SpO2:  [96 %-99 %] 96 % (10/16 0545)  Intake/Output from previous day: 10/15 0701 - 10/16 0700 In: -  Out: 1450 [Urine:1450] Intake/Output this shift:    Physical Exam:  General: Alert and oriented Abdomen: Soft, ND Incisions: C/D/I with minimal erythema around lateral portion of wound which appears reactive and is non-tender.  No drainage. Ext: NT, No erythema  Lab Results: BMET  Recent Labs  04/08/14 0500  NA 143  K 3.6*  CL 108  CO2 25  GLUCOSE 99  BUN 4*  CREATININE 0.97  CALCIUM 8.5      Assessment/Plan: - D/C home   LOS: 4 days   Alys Dulak,LES 04/10/2014, 7:37 AM

## 2014-04-10 NOTE — Discharge Summary (Signed)
Physician Discharge Summary  Patient ID: Jackie Mcconnell MRN: [id number] DOB/AGE: 01-24-1971 43 y.o.  Admit date: 04/06/2014 Discharge date: 04/10/2014  Admission Diagnoses:  Discharge Diagnoses:  Active Problems:   Renal neoplasm   Discharged Condition: stable  Hospital Course: The patient underwent open  left radical nephrectomy. They tolerated the procedure well and were transferred to the floor after routine recovery in PACU. Their diet was slowly advanced until they were able to tolerate regular food. They were able to ambulate and void after foley removal without difficulty. Pain was controlled initially with an epidural and PCA and was transitioned to PO pain pills. She was having flatus by POD#1. They were suitable for discharge on POD#4. Pathology returned benign (multilocular cystic nephroma).    Consults: None  Discharge Exam: Blood pressure 118/84, pulse 70, temperature 98.3 F (36.8 C), temperature source Oral, resp. rate 18, height 5\' 6"  (1.676 m), weight 101.606 kg (224 lb), SpO2 96.00%. AAOx3, in NAD. normal WOB. Incisions C/D/I. Abdomen soft, NT/ND.  Extremities WWP, no edema   Disposition:      Medication List         acetaminophen 500 MG tablet  Commonly known as:  TYLENOL  Take 1,000 mg by mouth once as needed for mild pain or headache.     amLODipine 10 MG tablet  Commonly known as:  NORVASC  Take 10 mg by mouth every evening.     calcium carbonate 500 MG chewable tablet  Commonly known as:  TUMS - dosed in mg elemental calcium  Chew 2 tablets by mouth once as needed for indigestion.     cyclobenzaprine 5 MG tablet  Commonly known as:  FLEXERIL  Take 5 mg by mouth 3 (three) times daily as needed for muscle spasms.     docusate sodium 100 MG capsule  Commonly known as:  COLACE  Take 1 capsule (100 mg total) by mouth 2 (two) times daily.     estradiol 0.5 MG tablet  Commonly known as:  ESTRACE  Take 0.5 mg by mouth every evening.     magic  mouthwash Soln  Take 10 mLs by mouth 3 (three) times daily as needed for mouth pain.     meloxicam 15 MG tablet  Commonly known as:  MOBIC  Take 15 mg by mouth daily as needed for pain.     MULTI VITAMIN DAILY PO  Take 1 tablet by mouth every morning.     ondansetron 4 MG tablet  Commonly known as:  ZOFRAN  Take 1 tablet (4 mg total) by mouth every 8 (eight) hours as needed for nausea or vomiting.     oxyCODONE-acetaminophen 5-325 MG per tablet  Commonly known as:  ROXICET  Take 1 tablet by mouth every 4 (four) hours as needed for severe pain.     XANAX 0.5 MG tablet  Generic drug:  ALPRAZolam  Take 0.5 mg by mouth 2 (two) times daily as needed for anxiety.           Follow-up Information   Follow up with [name], MD. (04/16/14 at 3:30 PM)    Specialty:  Urology   Contact information:   [place] [address] [phone]       Signed: Milon Score 04/10/2014, 8:59 AM

## 2014-07-13 ENCOUNTER — Ambulatory Visit (INDEPENDENT_AMBULATORY_CARE_PROVIDER_SITE_OTHER): Payer: 59 | Admitting: Family Medicine

## 2014-07-13 ENCOUNTER — Encounter: Payer: Self-pay | Admitting: Family Medicine

## 2014-07-13 VITALS — Ht 66.0 in | Wt 216.7 lb

## 2014-07-13 DIAGNOSIS — E669 Obesity, unspecified: Secondary | ICD-10-CM

## 2014-07-13 NOTE — Patient Instructions (Addendum)
-   Consider asking your surgeon to refer you for rehab, so you can get some guidance as to appropriate exercises.  GOALS:  1. Obtain a source of protein at each meal:  Beans, lentils, tofu, eggs, salmon, tuna, shrimp, cheese, milk, Greek yogurt.    - Portions:  One FULL cup for beans; 2 eggs; 4 oz of fish or tofu, no more than 2 oz cheese.    - For each meal, ask yourself, what's the source of protein? 2. In addition to walking the dog, get at least 20 min of exercise of any kind 3 X wk.    - Track # of minutes on your Goals Sheet.   3. Each Sunday, plan your exercise for the week:  WHEN and WHAT?  - Cereal:  Look for any with at least 5 grams of fiber per serviing.  If you still want Total, then add a high-fiber cereal (i.e., All-Bran) to it.  Also consider steel-cut oats (to which you can add 1/3 c powdered milk to each serving, after cooking.)  Cliff extra for the next few days, which can be microwaved.    - Google freeze-ahead meals for crockpot.

## 2014-07-13 NOTE — Progress Notes (Signed)
Medical Nutrition Therapy:  Appt start time: 1630 end time:  1730.  Assessment:  Primary concerns today: Weight management.  Jackie Mcconnell feels she's made some good changes food-wise, although she ate a lot of sweets through the holidays.  Jackie Mcconnell is eating more salads and veg's in general, but admits that protein per meal is still usually only moderate or low.  Is also packing lunch for work every day, and uses protein bars and fruit as a meal substitute about once a week.  She has bought a Water engineer, so can easily reseal left-over foods.  Jackie Mcconnell had surgery to have one kidney removed in October.  She has only recently been able to be more physically active; is walking her dog about a mile a day.  She received no specific guidance regarding getting back to exercise other than don't do it if it hurts.    Usually working 9A - 5P M-F.  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   24-hr recall:  (Up at 8:30 AM) B (9 AM)-  1 c Total, 1 c 2% milk 1 banana, 5-6 c black coffee all day Snk ( AM)-  --- L (12 PM)-  1 c hash browns (in butter), 2 T salsa, 2 T sour crm, handful tortilla chps, water Snk (3 PM)-  1 c green tea, 3 small ginger snaps D (6 PM)-  2 c Asian noodles w/ sauce (1 1/2 T pb, 1 tsp tahini, soy sauce, 1/2 T ses oil), veg's, water Snk ( PM)-  --- Typical day? Yes.    Progress Towards Goal(s):  In progress.   Nutritional Diagnosis:  NI-1.5 Excessive energy intake As related to expenditure.  As evidenced by imbalanced food distribution (inadequate protein, excess carbohydrate, inadequate physical activity).    Intervention:  Nutrition education.  Monitoring/Evaluation:  Dietary intake, exercise, and body weight in 4 week(s).

## 2014-08-18 ENCOUNTER — Encounter: Payer: Self-pay | Admitting: Family Medicine

## 2014-08-18 ENCOUNTER — Ambulatory Visit (INDEPENDENT_AMBULATORY_CARE_PROVIDER_SITE_OTHER): Payer: 59 | Admitting: Family Medicine

## 2014-08-18 VITALS — Ht 66.0 in | Wt 221.1 lb

## 2014-08-18 DIAGNOSIS — E669 Obesity, unspecified: Secondary | ICD-10-CM

## 2014-08-18 NOTE — Patient Instructions (Signed)
The Ds of Managing Stress Eating: Delay:  Neither urges nor emotions last forever Distract:  Engaging in another activity can truly get your mind off of food  Distance:  Move to another room (away from the food), go for a walk, or change your environment Determine:  What is happening here, and what are my options? Decide:  What will be your action plan? Be Deliberate in using the tools you learn!  Consider:  Hungry, Angry, Anxious, Lonely, Tired (or Bored / Depressed)  Keep ready access to the handout, Eating Decisions Algorithm  Keep Behavior Goals the same: 1. Obtain a source of protein at each meal: Beans, lentils, tofu, eggs, salmon, tuna, shrimp, cheese, milk, Greek yogurt.  - Portions: One FULL cup for beans; 2 eggs; 4 oz of fish or tofu, no more than 2 oz cheese.  - For each meal, ask yourself, what's the source of protein? 2. In addition to walking the dog, get at least 20 min of exercise of any kind 3 X wk.  - Track # of minutes on your Goals Sheet.  3. Each Sunday, plan your exercise for the week: WHEN and WHAT?  Books:  EAT.Q by Octavio Manns  Before your interview:  TED Talk by Dudley Major on body language.

## 2014-08-18 NOTE — Progress Notes (Signed)
Medical Nutrition Therapy:  Appt start time: 1630 end time:  1730.  Assessment:  Primary concerns today: Weight management.  [place] said she has not been making great food choices recently; has felt stressed since three friends have been diagnosed with cancer, and there has been some work-related stress recently for her.  She started off well, getting more protein, and bringing lunch most days, and she is drinking ~16 oz water before she leaves for work in the AM.  She is getting more fruit and veg's, but is snacking more in evenings.    24-hr recall:  (Up at 6:30 AM) B (6:40 AM)-  1 c Frosted Mini-Whts, 1 c 2% milk, 1 banana, 1 c coffee Snk (10 AM)-  1 [place] pro bar, 1 c coffee, water L (1 PM)-  1 pkt hi-pro oatmeal, water Snk ( PM)-   D (6:30 PM)-  1 c chx salad, croissant, salad, feta chs, pecans, cranber's, lite dressing, diet Coke Snk (7:30)-  1 choc turtle, 12 oz Bud Light Snk (10 PM)  1 c ice cream Typical day? No. Has usually been taking lunch to work daily.    Progress Towards Goal(s):  In progress.   Nutritional Diagnosis:  NI-1.5 Excessive energy intake As related to expenditure.  As evidenced by imbalanced food distribution (inadequate protein, excess carbohydrate, inadequate physical activity).    Intervention:  Nutrition education.  Monitoring/Evaluation:  Dietary intake, exercise, and body weight in 5 week(s).

## 2014-09-15 ENCOUNTER — Other Ambulatory Visit: Payer: Self-pay | Admitting: Family Medicine

## 2014-09-15 DIAGNOSIS — Z1231 Encounter for screening mammogram for malignant neoplasm of breast: Secondary | ICD-10-CM

## 2014-09-22 ENCOUNTER — Ambulatory Visit: Payer: 59 | Admitting: Family Medicine

## 2014-11-12 ENCOUNTER — Ambulatory Visit (INDEPENDENT_AMBULATORY_CARE_PROVIDER_SITE_OTHER): Payer: 59 | Admitting: Family Medicine

## 2014-11-12 ENCOUNTER — Encounter: Payer: Self-pay | Admitting: Family Medicine

## 2014-11-12 VITALS — Ht 66.0 in | Wt 223.4 lb

## 2014-11-12 DIAGNOSIS — E669 Obesity, unspecified: Secondary | ICD-10-CM | POA: Diagnosis not present

## 2014-11-12 NOTE — Progress Notes (Signed)
Medical Nutrition Therapy:  Appt start time: 6063 end time:  1630.  Assessment:  Primary concerns today: Weight management.  Jackie Mcconnell has added foods including lentils and beans as a protein source, and she has tried some new foods such as quinoa.  She has been bringing her lunch to work most days.  BP has been normal since her kidney was removed.  She is walking the dog 30-40 min 15 days a week, and although she has not added any other exercise, she has started a garden that requires some work each week.  Jackie Mcconnell said she is still frustrated that she has not been able to control stress eating, which she identifies as an obstacle to weight loss.    24-hr recall:  (Up at 6:30 AM) B (6:30 AM)-  Shredded Wheat 'n Bran, 2 tsp sugar, 1 banana, blk coffee Snk ( AM)-  --- L (1:30 PM)-  1 large salad, [place] sandwich, 1 tbsp mayo, baked chips, water, blk coffee Snk ( PM)-  --- D (6:30 PM)-  2 c meatless eggplant parmesan, 2 slc bread w/ olive oil, diet Coke Snk ( PM)-  1 tbsp pb on 2 rectangle gr crkers, 1 c coffee, 2 tbsp 2% milk, 1 1/2 tsp sugar Typical day? Yes.  Except that lunch is usually brought from home.    Progress Towards Goal(s):  In progress.   Nutritional Diagnosis:  Marginal progress on: NI-1.5 Excessive energy intake As related to expenditure.  As evidenced by imbalanced food distribution (inadequate protein, excess carbohydrate, inadequate physical activity).    Intervention:  Nutrition education.  Monitoring/Evaluation:  Dietary intake, exercise, and body weight in 5 week(s).

## 2014-11-12 NOTE — Patient Instructions (Signed)
-   Reminder:  EAT.Q. by Octavio Manns.  Read this with a highlighter in hand.   - Studies suggest that a meal consisting of at least 3 individual components is more satisfying than a one-dish meal.    - Always plate your food, and see all you eat.    - When you eat a meal, just eat a meal.    - You are worth the effort!  - Set aside some planning time on the weekend:  - Look ahead at your week to know what meals you will be home for, what days you will bring lunch to work, and what breakfast foods you need.    - Make a grocery list, and shop on the weekend.  (Buy more pre-washed mixed salad greens.)  - Determine your exercise days and what you'll do, including what plans need to be in place to ensure exercise happens, i.e., pack your clothes if working out after work.    - Document how you have done each day on your GOALS sheet; bring to follow-up.

## 2014-12-14 ENCOUNTER — Encounter: Payer: Self-pay | Admitting: Family Medicine

## 2014-12-14 ENCOUNTER — Ambulatory Visit (INDEPENDENT_AMBULATORY_CARE_PROVIDER_SITE_OTHER): Payer: 59 | Admitting: Family Medicine

## 2014-12-14 VITALS — Ht 66.0 in | Wt 222.6 lb

## 2014-12-14 DIAGNOSIS — E669 Obesity, unspecified: Secondary | ICD-10-CM

## 2014-12-14 NOTE — Patient Instructions (Signed)
-   Complete Goals Sheet, and bring to next appt.

## 2014-12-14 NOTE — Progress Notes (Signed)
Medical Nutrition Therapy:  Appt start time: 2330 end time:  1630.  Assessment:  Primary concerns today: Weight management.  Jackie Mcconnell did not record any of her behavior goals progress, and is not sure what is behind that resistance.  She has also noticed that on a lot of days she does not take time for lunch, or to even pay attn to her eating.  She has had a lot of stress in the past week - both at work and with family dynamics - but felt she had made pretty consistently good food choices in the previous three weeks.    Jackie Mcconnell is still bringing lunch to work most days, and is getting veg's daily.  She has started reading Eating in the Light of the [place], which she has found to be enlightening.    24-hr recall suggests intake of ~2500 kcal:  (Up at 8:30 AM) B (9 AM)-  2 small cinnamon buns, coffee      700 Snk ( AM)-   L (12:30 PM)-  1 1/2 slc quiche (mushrm, zucc, onion, chs), 1 c fruit salad, 3 Mimosas 900  Snk ( PM)-   D (6 PM)-  1 c potato salad, 1/4 c hummus, 21 small chips, diet Coke   700 Snk (9:30)-  1 oz peanuts, water        170 Typical day? No. Yesterday was Sunday.      Progress Towards Goal(s):  In progress.   Nutritional Diagnosis:  Continued marginal progress on: NI-1.5 Excessive energy intake As related to expenditure.  As evidenced by imbalanced food distribution (inadequate protein, excess carbohydrate, inadequate physical activity).    Intervention:  Nutrition education.  Monitoring/Evaluation:  Dietary intake, exercise, and body weight in 5 week(s).

## 2015-01-12 ENCOUNTER — Ambulatory Visit (INDEPENDENT_AMBULATORY_CARE_PROVIDER_SITE_OTHER): Payer: 59 | Admitting: Family Medicine

## 2015-01-12 ENCOUNTER — Encounter: Payer: Self-pay | Admitting: Family Medicine

## 2015-01-12 VITALS — Ht 66.0 in | Wt 222.9 lb

## 2015-01-12 DIAGNOSIS — E669 Obesity, unspecified: Secondary | ICD-10-CM | POA: Diagnosis not present

## 2015-01-12 NOTE — Progress Notes (Signed)
Medical Nutrition Therapy:  Appt start time: 7943 end time:  1630.  Assessment:  Primary concerns today: Weight management.  Jackie Mcconnell recorded progress on her Goals Sheet last week.  She has read most of Eating in the Light of the [place], however.  Jackie Mcconnell got selected as one of the [place] 100 last week, and realized she ws responding to the celebration by eating.  Jackie Mcconnell got a recumbent bike for home a week ago, which she has used 3 times.  She is still walking her dog daily, but these are slow walks with a lot of stop and go.  She has also walked a friend's dog, who is younger and walks right along.    Jackie Mcconnell is still not getting adequate protein most days.  She has noticed that she has better appetite control on days when protein intake is better.    24-hr recall:  (Up at 6:30 AM) B (7 AM)-  1 [place] protein bar (10 g pro), 1 c 2% milk, blk coffee Snk ( AM)-   L (12 PM)-  3/4 c pinto beans, 3/4 c collards, 1 slc bread, 1 tsp butter, 3/4 fried okra, blk coffee Snk (3 PM)-  1/2 small pkg M&Ms, water  D ( PM)-  2 c pasta salad w/ a lot of veg's & cheese, water Snk ( PM)-  1 cookie, 1 c 2% milk Typical day? Yes.  except most dinners have been at least 3-component meals.      Progress Towards Goal(s):  In progress.   Nutritional Diagnosis:  Continued marginal progress on: NI-1.5 Excessive energy intake As related to expenditure.  As evidenced by imbalanced food distribution (inadequate protein, excess carbohydrate, inadequate physical activity).    Intervention:  Nutrition education.  Monitoring/Evaluation:  Dietary intake, exercise, and body weight in 7 week(s).

## 2015-01-12 NOTE — Patient Instructions (Signed)
-   Increase protein by ensuring some meaningful amount with each meal.    - Beans, tofu, tempeh, egg, etc (see pro, starch, veg's lists handout) - Compete GOALS SHEET, and bring to follow-up.   - Finish EAT.Q. and order Eating in the Light of the [place].

## 2015-03-02 ENCOUNTER — Ambulatory Visit: Payer: 59 | Admitting: Family Medicine

## 2015-06-24 ENCOUNTER — Ambulatory Visit
Admission: RE | Admit: 2015-06-24 | Discharge: 2015-06-24 | Disposition: A | Discharge status: Home / Self Care | Payer: 59 | Source: Ambulatory Visit | Attending: Family Medicine | Admitting: Family Medicine

## 2015-06-24 DIAGNOSIS — Z1231 Encounter for screening mammogram for malignant neoplasm of breast: Secondary | ICD-10-CM

## 2015-06-29 LAB — HM MAMMOGRAPHY

## 2015-07-06 MED FILL — ALPRAZolam 0.5 MG TABS: 0.5 | 30 days supply | Qty: 15 | Fill #0

## 2015-07-21 MED FILL — ESTRADIOL 0.5 MG TABLET: 0.5 | 90 days supply | Qty: 90 | Fill #1

## 2015-10-12 LAB — BASIC METABOLIC PANEL: BUN: 10 mg/dL (ref 4–21)

## 2015-10-22 DIAGNOSIS — Z0000 Encounter for general adult medical examination without abnormal findings: Secondary | ICD-10-CM | POA: Diagnosis not present

## 2015-10-22 LAB — LIPID PANEL
Cholesterol: 201 mg/dL — AB (ref 0–200)
HDL: 61 mg/dL (ref 35–70)
LDL Cholesterol: 118 mg/dL
TRIGLYCERIDES: 109 mg/dL (ref 40–160)

## 2015-10-22 LAB — BASIC METABOLIC PANEL
CREATININE: 0.9 mg/dL (ref <=1.1)
GLUCOSE: 84 mg/dL
POTASSIUM: 4.3 mmol/L (ref 3.4–5.3)
Sodium: 138 mmol/L (ref 137–147)

## 2015-10-26 DIAGNOSIS — Z0000 Encounter for general adult medical examination without abnormal findings: Secondary | ICD-10-CM | POA: Diagnosis not present

## 2015-10-26 MED FILL — ESTRADIOL 0.5 MG TABLET: 0.5 | 90 days supply | Qty: 90 | Fill #0

## 2015-10-26 MED FILL — ALPRAZolam 0.5 MG TABS: 0.5 | 90 days supply | Qty: 45 | Fill #0

## 2015-10-26 MED FILL — traMADol HCL 50 MG TABS: 50 | 90 days supply | Qty: 180 | Fill #0

## 2015-12-20 ENCOUNTER — Ambulatory Visit (INDEPENDENT_AMBULATORY_CARE_PROVIDER_SITE_OTHER): Payer: 59 | Admitting: Family Medicine

## 2015-12-20 ENCOUNTER — Encounter: Payer: Self-pay | Admitting: General Practice

## 2015-12-20 ENCOUNTER — Encounter: Payer: Self-pay | Admitting: Family Medicine

## 2015-12-20 VITALS — BP 110/80 | HR 94 | Temp 98.6°F | Resp 16 | Ht 66.0 in | Wt 230.5 lb

## 2015-12-20 DIAGNOSIS — Z8679 Personal history of other diseases of the circulatory system: Secondary | ICD-10-CM | POA: Diagnosis not present

## 2015-12-20 DIAGNOSIS — E669 Obesity, unspecified: Secondary | ICD-10-CM

## 2015-12-20 DIAGNOSIS — F411 Generalized anxiety disorder: Secondary | ICD-10-CM | POA: Insufficient documentation

## 2015-12-20 NOTE — Assessment & Plan Note (Signed)
New to provider, ongoing for pt.  She reports this is fairly well controlled and is only requiring alprazolam, 'approximately 5x/month'.  Not interested in daily controller medication.  Will continue to follow.

## 2015-12-20 NOTE — Progress Notes (Signed)
Pre visit review using our clinic review tool, if applicable. No additional management support is needed unless otherwise documented below in the visit note. 

## 2015-12-20 NOTE — Progress Notes (Signed)
   Subjective:    Patient ID: Jackie Mcconnell, female    DOB: [birth year], 45 y.o.   MRN: [id number]  HPI New to establish.  Previous MD- [facility]  CPE done April 2017  No need for paps.  UTD on [place]  Obesity- ongoing issue for pt.  Pt is walking dog 3-4 days/week for 40 minutes.  Has previously seen nutritionist.  Wants to lose weight.  Pt reports she has the information and 'now I just need to do it'.  Pt admits to emotional eating.  Pt has lost 50+ lbs previously.  Hx of HTN- pt was on BP meds for years but once she had L nephrectomy for loculated cystic nephroma BP immediately improved and HTN resolved.  No longer requires medication.  No CP, SOB, HAs, visual changes.  Anxiety- much improved since finishing grad school.  Rarely using Alprazolam- ~5 days/month.  Review of Systems For ROS see HPI     Objective:   Physical Exam  Constitutional: She is oriented to person, place, and time. She appears well-developed and well-nourished. No distress.  HENT:  Head: Normocephalic and atraumatic.  Eyes: Conjunctivae and EOM are normal. Pupils are equal, round, and reactive to light.  Neck: Normal range of motion. Neck supple. No thyromegaly present.  Cardiovascular: Normal rate, regular rhythm, normal heart sounds and intact distal pulses.   No murmur heard. Pulmonary/Chest: Effort normal and breath sounds normal. No respiratory distress.  Abdominal: Soft. She exhibits no distension. There is no tenderness.  Musculoskeletal: She exhibits no edema.  Lymphadenopathy:    She has no cervical adenopathy.  Neurological: She is alert and oriented to person, place, and time.  Skin: Skin is warm and dry.  Psychiatric: She has a normal mood and affect. Her behavior is normal.  Vitals reviewed.         Assessment & Plan:

## 2015-12-20 NOTE — Patient Instructions (Signed)
Schedule your complete physical for April Continue to work on [employer] and regular exercise- you can do it! Try and plan your meals in advance to avoid impulse eating Prior to eating, ask yourself 'am I hungry?' and try and wait 15 minutes and see if you're still interested in food Call with any questions or concerns Welcome!!!  We're glad to have you!!!

## 2015-12-20 NOTE — Assessment & Plan Note (Signed)
New to provider, ongoing for pt.  She has a lot of insight into her problem and has the tools to improve her current eating and exercise routine (has worked with a nutritionist in the recent past).  Reviewed some small changes that could prove to be big dividends for her- advanced meal prep, eliminating high calorie beverages, asking herself whether she's actually hungry before eating, and making herself wait 15 minutes before snacking to see if she truly wants it.  Encouraged 4-5 days of exercise weekly for at least 30 minutes.  Reviewed recent labs.  Will continue to follow closely.

## 2015-12-20 NOTE — Assessment & Plan Note (Signed)
New to provider.  Pt had hx of HTN but after having her diseased L kidney removed, her BP normalized immediately.  Not currently on meds.  BP well controlled.  Asymptomatic at this time.  Will follow.

## 2016-02-08 MED FILL — ESTRADIOL 0.5 MG TABLET: 0.5 | 90 days supply | Qty: 90 | Fill #1

## 2016-04-14 MED FILL — ALPRAZolam 0.5 MG TABS: 0.5 | 90 days supply | Qty: 45 | Fill #1

## 2016-05-31 MED FILL — ESTRADIOL 0.5 MG TABLET: 0.5 | 90 days supply | Qty: 90 | Fill #2

## 2016-09-25 MED FILL — ESTRADIOL 0.5 MG TABLET: 0.5 | 90 days supply | Qty: 90 | Fill #3

## 2016-09-28 ENCOUNTER — Encounter: Payer: Self-pay | Admitting: Family Medicine

## 2016-09-28 MED ORDER — SCOPOLAMINE 1 MG/3DAYS TD PT72: 1.0000 | MEDICATED_PATCH | TRANSDERMAL | 0 refills | Status: DC

## 2016-10-27 ENCOUNTER — Encounter: Payer: Self-pay | Admitting: Family Medicine

## 2016-10-27 ENCOUNTER — Ambulatory Visit (INDEPENDENT_AMBULATORY_CARE_PROVIDER_SITE_OTHER): Payer: 59 | Admitting: Family Medicine

## 2016-10-27 DIAGNOSIS — Z0000 Encounter for general adult medical examination without abnormal findings: Secondary | ICD-10-CM | POA: Diagnosis not present

## 2016-10-27 LAB — LIPID PANEL
CHOL/HDL RATIO: 3
Cholesterol: 194 mg/dL (ref 0–200)
HDL: 61.7 mg/dL (ref >39.00)
LDL CALC: 117 mg/dL — AB (ref 0–99)
NonHDL: 132.58
TRIGLYCERIDES: 79 mg/dL (ref 0.0–149.0)
VLDL: 15.8 mg/dL (ref 0.0–40.0)

## 2016-10-27 LAB — CBC WITH DIFFERENTIAL/PLATELET
BASOS PCT: 0.7 % (ref 0.0–3.0)
Basophils Absolute: 0 10*3/uL (ref 0.0–0.1)
Eosinophils Absolute: 0.2 10*3/uL (ref 0.0–0.7)
Eosinophils Relative: 3.3 % (ref 0.0–5.0)
HCT: 43.7 % (ref 36.0–46.0)
Hemoglobin: 15 g/dL (ref 12.0–15.0)
LYMPHS ABS: 1.6 10*3/uL (ref 0.7–4.0)
Lymphocytes Relative: 30.1 % (ref 12.0–46.0)
MCHC: 34.4 g/dL (ref 30.0–36.0)
MCV: 93.4 fl (ref 78.0–100.0)
MONOS PCT: 7 % (ref 3.0–12.0)
Monocytes Absolute: 0.4 10*3/uL (ref 0.1–1.0)
NEUTROS ABS: 3.1 10*3/uL (ref 1.4–7.7)
NEUTROS PCT: 58.9 % (ref 43.0–77.0)
PLATELETS: 242 10*3/uL (ref 150.0–400.0)
RBC: 4.68 Mil/uL (ref 3.87–5.11)
RDW: 13.5 % (ref 11.5–15.5)
WBC: 5.3 10*3/uL (ref 4.0–10.5)

## 2016-10-27 LAB — BASIC METABOLIC PANEL
BUN: 9 mg/dL (ref 6–23)
CALCIUM: 10.1 mg/dL (ref 8.4–10.5)
CO2: 28 mEq/L (ref 19–32)
CREATININE: 0.93 mg/dL (ref 0.40–1.20)
Chloride: 105 mEq/L (ref 96–112)
GFR: 69.3 mL/min (ref >60.00)
Glucose, Bld: 86 mg/dL (ref 70–99)
Potassium: 4.2 mEq/L (ref 3.5–5.1)
Sodium: 140 mEq/L (ref 135–145)

## 2016-10-27 LAB — HEPATIC FUNCTION PANEL
ALT: 18 U/L (ref 0–35)
AST: 17 U/L (ref 0–37)
Albumin: 4.6 g/dL (ref 3.5–5.2)
Alkaline Phosphatase: 71 U/L (ref 39–117)
BILIRUBIN DIRECT: 0.1 mg/dL (ref 0.0–0.3)
Total Bilirubin: 0.5 mg/dL (ref 0.2–1.2)
Total Protein: 7 g/dL (ref 6.0–8.3)

## 2016-10-27 LAB — POCT URINALYSIS DIPSTICK
BILIRUBIN UA: NEGATIVE
Blood, UA: NEGATIVE
Glucose, UA: NEGATIVE
KETONES UA: NEGATIVE
LEUKOCYTES UA: NEGATIVE
Nitrite, UA: NEGATIVE
PH UA: 7 (ref 5.0–8.0)
Protein, UA: NEGATIVE
Spec Grav, UA: 1.015 (ref 1.010–1.025)
Urobilinogen, UA: 0.2 E.U./dL

## 2016-10-27 LAB — VITAMIN D 25 HYDROXY (VIT D DEFICIENCY, FRACTURES): VITD: 37.38 ng/mL (ref 30.00–100.00)

## 2016-10-27 LAB — TSH: TSH: 1.89 u[IU]/mL (ref 0.35–4.50)

## 2016-10-27 MED ORDER — ALPRAZOLAM 0.5 MG PO TABS: 0.5000 mg | ORAL_TABLET | Freq: Two times a day (BID) | ORAL | 3 refills | Status: DC | PRN

## 2016-10-27 MED FILL — ALPRAZolam 0.5 MG TABS: 0.5 | 15 days supply | Qty: 30 | Fill #0

## 2016-10-27 NOTE — Progress Notes (Signed)
   Subjective:    Patient ID: Jackie Mcconnell, female    DOB: 1971/03/25, 46 y.o.   MRN: [id number]  HPI CPE- no need for pap due to hysterectomy.  UTD on Tdap.  Due for mammo- pt plans to schedule   Review of Systems Patient reports no vision/ hearing changes, adenopathy,fever, weight change,  persistant/recurrent hoarseness , swallowing issues, chest pain, palpitations, edema, persistant/recurrent cough, hemoptysis, dyspnea (rest/exertional/paroxysmal nocturnal), gastrointestinal bleeding (melena, rectal bleeding), abdominal pain, significant heartburn, bowel changes, GU symptoms (dysuria, hematuria, incontinence), Gyn symptoms (abnormal  bleeding, pain),  syncope, focal weakness, memory loss, numbness & tingling, skin/hair/nail changes, abnormal bruising or bleeding, anxiety, or depression.     Objective:   Physical Exam General Appearance:    Alert, cooperative, no distress, appears stated age  Head:    Normocephalic, without obvious abnormality, atraumatic  Eyes:    PERRL, conjunctiva/corneas clear, EOM's intact, fundi    benign, both eyes  Ears:    Normal TM's and external ear canals, both ears  Nose:   Nares normal, septum midline, mucosa normal, no drainage    or sinus tenderness  Throat:   Lips, mucosa, and tongue normal; teeth and gums normal  Neck:   Supple, symmetrical, trachea midline, no adenopathy;    Thyroid: no enlargement/tenderness/nodules  Back:     Symmetric, no curvature, ROM normal, no CVA tenderness  Lungs:     Clear to auscultation bilaterally, respirations unlabored  Chest Wall:    No tenderness or deformity   Heart:    Regular rate and rhythm, S1 and S2 normal, no murmur, rub   or gallop  Breast Exam:    Deferred to mammo  Abdomen:     Soft, non-tender, bowel sounds active all four quadrants,    no masses, no organomegaly  Genitalia:    Deferred  Rectal:    Extremities:   Extremities normal, atraumatic, no cyanosis or edema  Pulses:   2+ and symmetric all  extremities  Skin:   Skin color, texture, turgor normal, no rashes or lesions  Lymph nodes:   Cervical, supraclavicular, and axillary nodes normal  Neurologic:   CNII-XII intact, normal strength, sensation and reflexes    throughout          Assessment & Plan:

## 2016-10-27 NOTE — Assessment & Plan Note (Signed)
Pt's PE WNL w/ exception of obesity.  UTD on immunizations.  Overdue for mammo- pt to call and schedule.  Check labs.  Anticipatory guidance provided.

## 2016-10-27 NOTE — Addendum Note (Signed)
Addended by: [name] on: 10/27/2016 11:43 AM   Modules accepted: Orders

## 2016-10-27 NOTE — Patient Instructions (Signed)
Follow up in 1 year or as needed We'll notify you of your lab results and make any changes if needed Continue to work on healthy diet and regular exercise- you can do it! Call and schedule your mammogram! Call with any questions or concerns [place]!!!!

## 2016-10-27 NOTE — Progress Notes (Signed)
Pre visit review using our clinic review tool, if applicable. No additional management support is needed unless otherwise documented below in the visit note. 

## 2016-10-30 ENCOUNTER — Other Ambulatory Visit: Payer: Self-pay | Admitting: Family Medicine

## 2016-10-30 DIAGNOSIS — Z1231 Encounter for screening mammogram for malignant neoplasm of breast: Secondary | ICD-10-CM

## 2016-11-17 ENCOUNTER — Ambulatory Visit
Admission: RE | Admit: 2016-11-17 | Discharge: 2016-11-17 | Disposition: A | Discharge status: Home / Self Care | Payer: 59 | Source: Ambulatory Visit | Attending: Family Medicine | Admitting: Family Medicine

## 2016-11-17 DIAGNOSIS — Z1231 Encounter for screening mammogram for malignant neoplasm of breast: Secondary | ICD-10-CM

## 2016-12-07 MED FILL — ALPRAZolam 0.5 MG TABS: 0.5 | 15 days supply | Qty: 30 | Fill #1

## 2016-12-18 MED FILL — ESTRADIOL 0.5 MG TABLET: 0.5 | 90 days supply | Qty: 90 | Fill #0

## 2017-02-14 MED FILL — ALPRAZolam 0.5 MG TABS: 0.5 | 15 days supply | Qty: 30 | Fill #2

## 2017-02-27 ENCOUNTER — Ambulatory Visit: Payer: 59 | Admitting: Family Medicine

## 2017-03-06 ENCOUNTER — Ambulatory Visit: Payer: 59 | Admitting: Family Medicine

## 2017-03-30 MED FILL — ALPRAZolam 0.5 MG TABS: 0.5 | 15 days supply | Qty: 30 | Fill #3

## 2017-04-11 ENCOUNTER — Other Ambulatory Visit: Payer: Self-pay | Admitting: General Practice

## 2017-04-11 MED ORDER — ESTRADIOL 0.5 MG PO TABS: 0.5000 mg | ORAL_TABLET | Freq: Every evening | ORAL | 1 refills | Status: DC

## 2017-04-11 MED FILL — ESTRADIOL 0.5 MG TABLET: 0.5 | 90 days supply | Qty: 90 | Fill #0

## 2017-04-11 NOTE — Telephone Encounter (Signed)
Please advise, this is listed as historical provider.   Per pharmacy last filled 12/07/16 #90 with 0

## 2017-04-24 ENCOUNTER — Encounter: Payer: Self-pay | Admitting: Family Medicine

## 2017-04-24 ENCOUNTER — Ambulatory Visit (INDEPENDENT_AMBULATORY_CARE_PROVIDER_SITE_OTHER): Payer: 59 | Admitting: Family Medicine

## 2017-04-24 DIAGNOSIS — E669 Obesity, unspecified: Secondary | ICD-10-CM | POA: Diagnosis not present

## 2017-04-24 NOTE — Patient Instructions (Addendum)
-   Complete the meal planning form provided today.    - Use as a basis for shopping.   - When you use beans as a protein source, you need at least one full cup of beans.    - For the next couple of weeks, DO incorporate some animal protein (dairy, eggs, fish).   - PLANNING: Set aside some planning time on the weekend, and shop and do at least some minimal food prep as well.    - Establish a USUAL grocery list that you can consult each week.   Specific Behavioral Goals:  1. Get at least 30 minutes of exercise 5 X wk, not counting walking the dog.    - Remember the 5-minute rule! 2. Establish a bedtime routine, and be consistent with it!  - Write this down, and email your routine to [facility] no later than November 6.  Include times!  - Make your room as dark as possible.   - Guided meditation or Yoga [place].  AT FOLLOW-UP, WE'LL SET A DIET-RELATED GOAL.

## 2017-04-24 NOTE — Progress Notes (Signed)
Medical Nutrition Therapy:  Appt start time: 0900 end time:  1000.  Assessment:  Primary concerns today: Weight management.  Jackie Mcconnell said she is way off track with respect to eating and exercise behaviors.  There is a lot of stress at work, and she is usually working ~10-hr days, so time for both food prep and exercise is limited.  There is a lot of junk food and sweets around at work, e.g., one of the cardiologists brought in cupcakes for a 10 AM meeting yesterday, and there is currently much Halloween candy at work.  Although [place] prefers to eat mainly vegetarian, she has not been making much effort to be sure meals are balanced, and contain a significant protein source.  Meals have usually been just whatever is easy and convenient.  Asna enjoys cooking, but time constraints preclude doing much.  She is receptive to cooking on weekends, and doesn't mind eating leftovers.    Learning Readiness: Ready  Usual eating pattern includes 2 meals and 3-4 snacks per day. Frequent foods and beverages include water, 5 c decaf coffee/day, breakfast/protein bars, popcorn, sweets and candy, salad, vegetables.  Avoided foods include flesh foods, most dairy, caffeine.   Usual physical activity includes walking the dog 20-30 min 3 X wk.  24-hr recall: (Up at 7 AM; glass of water)        Protein B (8:30 AM)-   1 [place] alm butter biscuit, 1 c coffee, 1/4 c whole milk    5 Snk ( AM)-   --- L (12 PM)-  1 1/2 c mac&chs, water        7 Snk (4 PM)-  1 bag M-wave popcorn        8 Snk (5-7 PM)-  1 mini-M&Ms, Starburst, choc nugget, mini-Tootsie Roll, 1 [employer] alm butter biscuit D (7:30 PM)-   Mc[employer]'s fish sandw, med FFs, diet Coke     18 + 3 Snk ( PM)-  --- Typical day? Yes.  Ramon eats fast food only about 2 X wk.    Progress Towards Goal(s):  In progress.   Nutritional Diagnosis:  NI-5.7.1 Inadequate protein intake As related to protein requirements.  As evidenced by usual intake that includes no  meaningful protein source at two of three daily meals.    Intervention:  Nutrition counseling Handouts given during visit include:  AVS  Meal Planning form  Demonstrated degree of understanding via:  Teach Back  Barriers to learning/adherence to lifestyle change: High stress levels at work and limited time.   Monitoring/Evaluation:  Dietary intake, exercise, and body weight in 4 week(s).

## 2017-05-22 ENCOUNTER — Ambulatory Visit (INDEPENDENT_AMBULATORY_CARE_PROVIDER_SITE_OTHER): Payer: 59 | Admitting: Family Medicine

## 2017-05-22 ENCOUNTER — Encounter: Payer: Self-pay | Admitting: Family Medicine

## 2017-05-22 DIAGNOSIS — E669 Obesity, unspecified: Secondary | ICD-10-CM

## 2017-05-22 NOTE — Patient Instructions (Addendum)
Tape your meals list to the inside of a kitchen cabinet.    Goals: 1. Use the stationary bike first thing in the morning: 30 min 5 X wk.    - On weekends, get exercise done sometime in the morning.    - For some variety, use both the elliptical and different bike workouts or consider designing an interval workout.   - Until your workouts become routine, put a reminder alert in your phone the night before, and get your clothes ready.   2. Include protein at each meal.    - Quick breakfast options: Sandwich (cheese, egg, or peanut butter), frozen vegetarian breakfast, oatmeal* (pre-cooked), [place] yogurt with nuts and fruit, 2 hard-boiled eggs, 2 slices of bread.   3. Include vegetables at both lunch and dinner at least 5 days a week.    - Keep frozen veg's on hand, including at work.    - Consider on take-out days where you can get vegetables.  (Consider what veg's you can bring on take-out days.)  *Make with a little extra water, and add powdered milk or protein powder to each serving.    Consider the Managing Non-Hunger Eating class that starts Feb 4 at [facility].

## 2017-05-22 NOTE — Progress Notes (Signed)
Medical Nutrition Therapy:  Appt start time: 0900 end time:  1000.  Assessment:  Primary concerns today: Weight management.  Jackie Mcconnell has established a bedtime routine for herself, and has been getting to bed earlier.  She does feel a bit more rested, and is pleased with her progress in this area.  She started using a stationary bike at home just last week, but has consistently been walking her dog about 3 times a week.  She admits that food choices have not been great.  Time constraints remain the biggest obstacle.    Learning Readiness: Ready  Recent physical activity includes walking the dog 20-30 min 3 X wk, and she used her exercise bike 3 X last week.    24-hr recall:  (Up at 6 AM) B (7 AM)-  1 blueberry breakfast biscuit (236 kcal), 1 c coffee, 1/3 c whole milk Snk (10 AM)-  2 scrmbld eggs, 1 biscuit, 1 c gravy, 1/2 c stewed apples, 12 oz o.j.  L ( PM)-  --- Snk (3 PM)-  1 1/2 c mushrm risotto, 5 oz Grk yogurt, water, 2 c blk coffee Snk (6 PM)-  Choc glazed donut D (8 PM)-  1(+) c veg's, ranch dip, diet Coke  Snk ( PM)-  --- Typical day? No. Went to visit friend in nursing home after work; doesn't usually eat a donut.    Progress Towards Goal(s):  In progress.   Nutritional Diagnosis:  No significant progress on NI-5.7.1 Inadequate protein intake As related to protein requirements.  As evidenced by usual intake that includes no meaningful protein source at two of three daily meals.    Intervention:  Nutrition counseling Handouts given during visit include:  AVS  Goals Sheet, revised   Barriers to learning/adherence to lifestyle change: High stress levels at work and limited time.   Monitoring/Evaluation:  Dietary intake, exercise, and body weight in 6 week(s).

## 2017-07-03 ENCOUNTER — Ambulatory Visit (INDEPENDENT_AMBULATORY_CARE_PROVIDER_SITE_OTHER): Payer: 59 | Admitting: Family Medicine

## 2017-07-03 ENCOUNTER — Encounter: Payer: Self-pay | Admitting: Family Medicine

## 2017-07-03 DIAGNOSIS — E669 Obesity, unspecified: Secondary | ICD-10-CM | POA: Diagnosis not present

## 2017-07-03 NOTE — Progress Notes (Signed)
Medical Nutrition Therapy:  Appt start time: 0900 end time:  1000.  Assessment:  Primary concerns today: Weight management.  Jackie Mcconnell has been exercising in the mornings - recumbent bike 30 min 5 X wk, except one day she missed when her mom was hospitalized.  She has occasionally used the elliptical.  She is still walking her dog 15-20 min most days.  Her BP has has been better, and she is not taking as much Xanax.  She has also done better with advance planning for her exercise.  She is still struggling with getting to bed on time, and with food choices, but is overall feeling better physically as well as feeling better about herself.    Learning Readiness: Ready  Progress Towards Goal(s):  In progress.   Nutritional Diagnosis:  No significant progress on NI-5.7.1 Inadequate protein intake As related to protein requirements.  As evidenced by usual intake that includes no meaningful protein source at two of three daily meals.    Intervention:  Nutrition counseling Handouts given during visit include:  AVS  Barriers to learning/adherence to lifestyle change: High stress levels at work and limited time.   Monitoring/Evaluation:  Dietary intake, exercise, and body weight in 10 week(s).

## 2017-07-03 NOTE — Patient Instructions (Addendum)
-   Register for the Living Well: Managing Non-Hunger Eating at [email or web address] that meets Feb 4, 11, 18, & 25 from 5:30-6:30 PM at [facility].  Goals remain the same: 1. Use the stationary bike first thing in the morning: 30 min 5 X wk.               - On weekends, get exercise done sometime in the morning.               - For some variety, use both the elliptical and different bike workouts or consider designing an interval workout.              - Until your workouts become routine, put a reminder alert in your phone the night before, and get your clothes ready.   2. Include protein at each meal.  Log your protein foods in your app.               - Quick breakfast options: Sandwich (cheese, egg, or peanut butter), frozen vegetarian breakfast, oatmeal* (pre-cooked), [place] yogurt with nuts and fruit, 2 hard-boiled eggs, 2 slices of bread.   3. Include vegetables at both lunch and dinner at least 5 days a week.               - Keep frozen veg's on hand, including at work.               - Consider on take-out days where you can get vegetables.  (Consider what veg's you can bring on take-out days.)  SPEND AT [place] PLANNING/PREPPING FOODS FOR THE NEXT DAY.  Place a checkmark on the calendar each time you've done the day's planning.  (And include your protein intake for the day.)  Protein: Aim for at least 55 grams per day or around 20 grams per meal.    Follow-up appt Mar 19 at 9 AM.

## 2017-07-23 MED FILL — ESTRADIOL 0.5 MG TABS: 0.5 | 90 days supply | Qty: 90 | Fill #1

## 2017-08-08 ENCOUNTER — Other Ambulatory Visit: Payer: Self-pay

## 2017-08-08 ENCOUNTER — Encounter: Payer: Self-pay | Admitting: Family Medicine

## 2017-08-08 ENCOUNTER — Ambulatory Visit (INDEPENDENT_AMBULATORY_CARE_PROVIDER_SITE_OTHER): Payer: 59 | Admitting: Family Medicine

## 2017-08-08 DIAGNOSIS — R140 Abdominal distension (gaseous): Secondary | ICD-10-CM

## 2017-08-08 DIAGNOSIS — K219 Gastro-esophageal reflux disease without esophagitis: Secondary | ICD-10-CM | POA: Diagnosis not present

## 2017-08-08 DIAGNOSIS — R0989 Other specified symptoms and signs involving the circulatory and respiratory systems: Secondary | ICD-10-CM | POA: Diagnosis not present

## 2017-08-08 DIAGNOSIS — D3000 Benign neoplasm of unspecified kidney: Secondary | ICD-10-CM | POA: Insufficient documentation

## 2017-08-08 HISTORY — DX: Gastro-esophageal reflux disease without esophagitis: K21.9

## 2017-08-08 MED ORDER — PANTOPRAZOLE SODIUM 40 MG PO TBEC: 40.0000 mg | DELAYED_RELEASE_TABLET | Freq: Every day | ORAL | 3 refills | Status: DC

## 2017-08-08 NOTE — Assessment & Plan Note (Signed)
Pt has hx of very large L sided cystic nephroma that caused very similar sxs in 2015- labile BP, GERD, bloating, urinary frequency, and constipation.  Pt reports all sxs resolved s/p nephrectomy.  Will get abd [place] to assess for possible R sided nephroma and determine next steps.  Pt expressed understanding and is in agreement w/ plan.

## 2017-08-08 NOTE — Assessment & Plan Note (Signed)
BP is well controlled today but she reports labile BPs recently.  Hx of similar w/ previous nephroma.  Will follow.

## 2017-08-08 NOTE — Progress Notes (Signed)
   Subjective:    Patient ID: Jackie Mcconnell, female    DOB: [birth year], 47 y.o.   MRN: [id number]  HPI 'i'm having the same sxs I was before in 2015 when I had a loculated cystic nephroma'- + GERD, bloating, urinary frequency, constipation, labile blood pressure.  Denies abd pain.  No palpable mass like last time.  sxs started 6-8 months ago.  No fevers.  Has tried 2 separate OTC txs for GERD w/o relief.  Pt is seeing a dietician, exercising 4-5x/week but still feeling poorly.   Review of Systems For ROS see HPI     Objective:   Physical Exam  Constitutional: She is oriented to person, place, and time. She appears well-developed and well-nourished. No distress.  obese  HENT:  Head: Normocephalic and atraumatic.  Cardiovascular: Normal rate, regular rhythm and normal heart sounds.  Pulmonary/Chest: Effort normal and breath sounds normal. No respiratory distress. She has no wheezes. She has no rales.  Abdominal: Soft. Bowel sounds are normal. She exhibits no distension and no mass. There is tenderness (mild discomfort over R side lateral to umbilicus). There is no rebound and no guarding.  Neurological: She is alert and oriented to person, place, and time.  Skin: Skin is warm and dry.  Psychiatric: She has a normal mood and affect. Her behavior is normal. Thought content normal.  Vitals reviewed.         Assessment & Plan:

## 2017-08-08 NOTE — Patient Instructions (Signed)
I'll notify you of your lab results and the [place] results and we'll determine the next steps START the Pantoprazole daily to improve reflux Call with any questions or concerns Hang in there!  We'll figure this out!

## 2017-08-08 NOTE — Assessment & Plan Note (Signed)
New.  Pt is having increased GERD, bloating.  No relief w/ Zantac and Omeprazole.  Start Protonix daily.  Will follow.

## 2017-08-08 NOTE — Assessment & Plan Note (Signed)
New to provider, pt has hx of similar w/ hx of nephroma.  Start PPI, reviewed dietary modifications.  Check labs.  Will follow.

## 2017-08-09 ENCOUNTER — Other Ambulatory Visit: Payer: Self-pay | Admitting: Family Medicine

## 2017-08-09 ENCOUNTER — Ambulatory Visit (HOSPITAL_COMMUNITY)
Admission: RE | Admit: 2017-08-09 | Discharge: 2017-08-09 | Disposition: A | Discharge status: Home / Self Care | Payer: 59 | Source: Ambulatory Visit | Attending: Family Medicine | Admitting: Family Medicine

## 2017-08-09 DIAGNOSIS — R140 Abdominal distension (gaseous): Secondary | ICD-10-CM | POA: Diagnosis not present

## 2017-08-09 DIAGNOSIS — D3000 Benign neoplasm of unspecified kidney: Secondary | ICD-10-CM | POA: Diagnosis not present

## 2017-08-09 DIAGNOSIS — R932 Abnormal findings on diagnostic imaging of liver and biliary tract: Secondary | ICD-10-CM | POA: Insufficient documentation

## 2017-08-09 DIAGNOSIS — R0989 Other specified symptoms and signs involving the circulatory and respiratory systems: Secondary | ICD-10-CM | POA: Insufficient documentation

## 2017-08-09 DIAGNOSIS — K8020 Calculus of gallbladder without cholecystitis without obstruction: Secondary | ICD-10-CM | POA: Diagnosis not present

## 2017-08-09 LAB — HEPATIC FUNCTION PANEL
AG Ratio: 1.8 (calc) (ref 1.0–2.5)
ALBUMIN MSPROF: 4.5 g/dL (ref 3.6–5.1)
ALKALINE PHOSPHATASE (APISO): 69 U/L (ref 33–115)
ALT: 23 U/L (ref 6–29)
AST: 21 U/L (ref 10–35)
Bilirubin, Direct: 0.1 mg/dL (ref 0.0–0.2)
Globulin: 2.5 g/dL (calc) (ref 1.9–3.7)
Indirect Bilirubin: 0.2 mg/dL (calc) (ref 0.2–1.2)
TOTAL PROTEIN: 7 g/dL (ref 6.1–8.1)
Total Bilirubin: 0.3 mg/dL (ref 0.2–1.2)

## 2017-08-09 LAB — CBC WITH DIFFERENTIAL/PLATELET
Basophils Absolute: 39 cells/uL (ref 0–200)
Basophils Relative: 0.5 %
EOS PCT: 2.7 %
Eosinophils Absolute: 208 cells/uL (ref 15–500)
HCT: 41.3 % (ref 35.0–45.0)
Hemoglobin: 14.5 g/dL (ref 11.7–15.5)
Lymphs Abs: 2033 cells/uL (ref 850–3900)
MCH: 31.4 pg (ref 27.0–33.0)
MCHC: 35.1 g/dL (ref 32.0–36.0)
MCV: 89.4 fL (ref 80.0–100.0)
MONOS PCT: 8.3 %
MPV: 10.2 fL (ref 7.5–12.5)
Neutro Abs: 4782 cells/uL (ref 1500–7800)
Neutrophils Relative %: 62.1 %
Platelets: 275 10*3/uL (ref 140–400)
RBC: 4.62 10*6/uL (ref 3.80–5.10)
RDW: 13 % (ref 11.0–15.0)
TOTAL LYMPHOCYTE: 26.4 %
WBC mixed population: 639 cells/uL (ref 200–950)
WBC: 7.7 10*3/uL (ref 3.8–10.8)

## 2017-08-09 LAB — BASIC METABOLIC PANEL
BUN: 14 mg/dL (ref 7–25)
CHLORIDE: 106 mmol/L (ref 98–110)
CO2: 27 mmol/L (ref 20–32)
Calcium: 9.3 mg/dL (ref 8.6–10.2)
Creat: 0.91 mg/dL (ref 0.50–1.10)
Glucose, Bld: 106 mg/dL — ABNORMAL HIGH (ref 65–99)
Potassium: 4.1 mmol/L (ref 3.5–5.3)
Sodium: 141 mmol/L (ref 135–146)

## 2017-08-09 LAB — TSH: TSH: 1.96 m[IU]/L

## 2017-08-09 NOTE — Telephone Encounter (Signed)
Last OV 08/08/17 Alprazolam last filled 10/27/16 #30 with 3

## 2017-09-05 MED FILL — PANTOPRAZOLE SOD DR 40 MG T: 40 | 90 days supply | Qty: 90 | Fill #0

## 2017-09-11 ENCOUNTER — Ambulatory Visit: Payer: 59 | Admitting: Family Medicine

## 2017-10-29 ENCOUNTER — Other Ambulatory Visit: Payer: Self-pay

## 2017-10-29 ENCOUNTER — Ambulatory Visit: Payer: 59 | Admitting: Family Medicine

## 2017-10-29 ENCOUNTER — Encounter: Payer: Self-pay | Admitting: Family Medicine

## 2017-10-29 VITALS — BP 121/86 | HR 76 | Temp 97.9°F | Resp 16 | Ht 66.0 in | Wt 242.1 lb

## 2017-10-29 DIAGNOSIS — Z0001 Encounter for general adult medical examination with abnormal findings: Secondary | ICD-10-CM

## 2017-10-29 DIAGNOSIS — Z0000 Encounter for general adult medical examination without abnormal findings: Secondary | ICD-10-CM

## 2017-10-29 DIAGNOSIS — R0989 Other specified symptoms and signs involving the circulatory and respiratory systems: Secondary | ICD-10-CM

## 2017-10-29 DIAGNOSIS — E669 Obesity, unspecified: Secondary | ICD-10-CM

## 2017-10-29 LAB — LIPID PANEL
CHOL/HDL RATIO: 4
CHOLESTEROL: 206 mg/dL — AB (ref 0–200)
HDL: 56.9 mg/dL (ref >39.00)
LDL CALC: 131 mg/dL — AB (ref 0–99)
NonHDL: 148.92
Triglycerides: 92 mg/dL (ref 0.0–149.0)
VLDL: 18.4 mg/dL (ref 0.0–40.0)

## 2017-10-29 LAB — POCT URINALYSIS DIPSTICK
BILIRUBIN UA: NEGATIVE
Glucose, UA: NEGATIVE
KETONES UA: NEGATIVE
Leukocytes, UA: NEGATIVE
Nitrite, UA: NEGATIVE
PH UA: 7 (ref 5.0–8.0)
Protein, UA: NEGATIVE
RBC UA: NEGATIVE
Spec Grav, UA: 1.01 (ref 1.010–1.025)
UROBILINOGEN UA: 0.2 U/dL

## 2017-10-29 LAB — BASIC METABOLIC PANEL
BUN: 10 mg/dL (ref 6–23)
CO2: 27 mEq/L (ref 19–32)
CREATININE: 0.9 mg/dL (ref 0.40–1.20)
Calcium: 9.7 mg/dL (ref 8.4–10.5)
Chloride: 104 mEq/L (ref 96–112)
GFR: 71.65 mL/min (ref >60.00)
GLUCOSE: 90 mg/dL (ref 70–99)
Potassium: 3.8 mEq/L (ref 3.5–5.1)
Sodium: 141 mEq/L (ref 135–145)

## 2017-10-29 LAB — HEPATIC FUNCTION PANEL
ALT: 21 U/L (ref 0–35)
AST: 21 U/L (ref 0–37)
Albumin: 4.5 g/dL (ref 3.5–5.2)
Alkaline Phosphatase: 67 U/L (ref 39–117)
Bilirubin, Direct: 0.1 mg/dL (ref 0.0–0.3)
TOTAL PROTEIN: 7.2 g/dL (ref 6.0–8.3)
Total Bilirubin: 0.4 mg/dL (ref 0.2–1.2)

## 2017-10-29 LAB — CBC WITH DIFFERENTIAL/PLATELET
Basophils Absolute: 0 10*3/uL (ref 0.0–0.1)
Basophils Relative: 0.6 % (ref 0.0–3.0)
Eosinophils Absolute: 0.1 10*3/uL (ref 0.0–0.7)
Eosinophils Relative: 2.7 % (ref 0.0–5.0)
HCT: 43.9 % (ref 36.0–46.0)
Hemoglobin: 15.1 g/dL — ABNORMAL HIGH (ref 12.0–15.0)
LYMPHS ABS: 1.7 10*3/uL (ref 0.7–4.0)
Lymphocytes Relative: 35.3 % (ref 12.0–46.0)
MCHC: 34.4 g/dL (ref 30.0–36.0)
MCV: 91.5 fl (ref 78.0–100.0)
MONOS PCT: 8.4 % (ref 3.0–12.0)
Monocytes Absolute: 0.4 10*3/uL (ref 0.1–1.0)
Neutro Abs: 2.5 10*3/uL (ref 1.4–7.7)
Neutrophils Relative %: 53 % (ref 43.0–77.0)
Platelets: 240 10*3/uL (ref 150.0–400.0)
RBC: 4.8 Mil/uL (ref 3.87–5.11)
RDW: 13.6 % (ref 11.5–15.5)
WBC: 4.7 10*3/uL (ref 4.0–10.5)

## 2017-10-29 LAB — TSH: TSH: 2.25 u[IU]/mL (ref 0.35–4.50)

## 2017-10-29 MED ORDER — METOPROLOL SUCCINATE ER 25 MG PO TB24: 25.0000 mg | ORAL_TABLET | Freq: Every day | ORAL | 3 refills | Status: DC

## 2017-10-29 MED FILL — METOPROLOL SUCCINATE ER 25: 25 | 30 days supply | Qty: 30 | Fill #0

## 2017-10-29 NOTE — Assessment & Plan Note (Signed)
Ongoing issue for pt.  Stressed need for healthy diet and regular exercise  Check labs to risk stratify.  Will follow closely 

## 2017-10-29 NOTE — Assessment & Plan Note (Signed)
Pt's PE WNL w/ exception of obesity.  Due for mammo- pt to schedule.  No need for pap due to hysterectomy.  UTD on immunizations.  Check labs.  Anticipatory guidance provided.

## 2017-10-29 NOTE — Patient Instructions (Addendum)
Follow up in 1 months to recheck BP We'll notify you of your lab results and make any changes if needed START Metoprolol once daily Continue to work on healthy diet and regular exercise- you can do it!!! Continue to work on Occupational psychologist and schedule your mammogram Call with any questions or concerns Happy [place]!!!

## 2017-10-29 NOTE — Assessment & Plan Note (Signed)
Pt was very worried that her initial reading was lower than it actually is and asked me to recheck this.  By this time, she had worked herself up and BP was 138/104.  She feels this is more inline w/ her actual readings.  She is interested in starting a medication that could improve BP- without damaging her solitary kidney- and improve anxiety.  Will start low dose beta blocker.  Pt expressed understanding and is in agreement w/ plan.

## 2017-10-29 NOTE — Progress Notes (Signed)
   Subjective:    Patient ID: Jackie Mcconnell, female    DOB: 30-Jun-1970, 47 y.o.   MRN: [id number]  HPI CPE- no need for pap due to TAH.  Due for mammo end of this month.  UTD on immunizations.  Too young for colonoscopy.   Review of Systems Patient reports no vision/ hearing changes, adenopathy,fever, weight change,  persistant/recurrent hoarseness , swallowing issues, chest pain, palpitations, edema, persistant/recurrent cough, hemoptysis, dyspnea (rest/exertional/paroxysmal nocturnal), gastrointestinal bleeding (melena, rectal bleeding), abdominal pain, significant heartburn, bowel changes, GU symptoms (dysuria, hematuria, incontinence), Gyn symptoms (abnormal  bleeding, pain),  syncope, focal weakness, memory loss, numbness & tingling, skin/hair/nail changes, abnormal bruising or bleeding, or depression.   + anxiety    Objective:   Physical Exam General Appearance:    Alert, cooperative, no distress, appears stated age, obese  Head:    Normocephalic, without obvious abnormality, atraumatic  Eyes:    PERRL, conjunctiva/corneas clear, EOM's intact, fundi    benign, both eyes  Ears:    Normal TM's and external ear canals, both ears  Nose:   Nares normal, septum midline, mucosa normal, no drainage    or sinus tenderness  Throat:   Lips, mucosa, and tongue normal; teeth and gums normal  Neck:   Supple, symmetrical, trachea midline, no adenopathy;    Thyroid: no enlargement/tenderness/nodules  Back:     Symmetric, no curvature, ROM normal, no CVA tenderness  Lungs:     Clear to auscultation bilaterally, respirations unlabored  Chest Wall:    No tenderness or deformity   Heart:    Regular rate and rhythm, S1 and S2 normal, no murmur, rub   or gallop  Breast Exam:    Deferred to mammo  Abdomen:     Soft, non-tender, bowel sounds active all four quadrants,    no masses, no organomegaly  Genitalia:    Deferred  Rectal:    Extremities:   Extremities normal, atraumatic, no cyanosis or edema   Pulses:   2+ and symmetric all extremities  Skin:   Skin color, texture, turgor normal, no rashes or lesions  Lymph nodes:   Cervical, supraclavicular, and axillary nodes normal  Neurologic:   CNII-XII intact, normal strength, sensation and reflexes    throughout          Assessment & Plan:

## 2017-10-30 ENCOUNTER — Encounter: Payer: Self-pay | Admitting: General Practice

## 2017-11-01 ENCOUNTER — Other Ambulatory Visit: Payer: Self-pay | Admitting: Family Medicine

## 2017-11-01 NOTE — Telephone Encounter (Signed)
Last OV 10/29/17, Next OV 11/30/17  Last filled 04/11/17, #90 with 1 refill  Please advise if okay to continue this medication

## 2017-11-14 MED FILL — ALPRAZolam 0.5 MG TABS: 0.5 | 15 days supply | Qty: 30 | Fill #0

## 2017-11-14 MED FILL — ESTRADIOL 0.5 MG TABS: 0.5 | 90 days supply | Qty: 90 | Fill #0

## 2017-11-22 MED FILL — METOPROLOL SUCCINATE ER 25: 25 | 30 days supply | Qty: 30 | Fill #1

## 2017-11-28 ENCOUNTER — Other Ambulatory Visit: Payer: Self-pay | Admitting: Family Medicine

## 2017-11-28 MED FILL — PANTOPRAZOLE SOD DR 40 MG T: 40 | 30 days supply | Qty: 30 | Fill #0

## 2017-11-30 ENCOUNTER — Encounter: Payer: Self-pay | Admitting: Family Medicine

## 2017-11-30 ENCOUNTER — Other Ambulatory Visit: Payer: Self-pay

## 2017-11-30 ENCOUNTER — Ambulatory Visit: Payer: 59 | Admitting: Family Medicine

## 2017-11-30 VITALS — BP 122/84 | HR 87 | Temp 98.1°F | Resp 16 | Ht 66.0 in | Wt 243.5 lb

## 2017-11-30 DIAGNOSIS — R0989 Other specified symptoms and signs involving the circulatory and respiratory systems: Secondary | ICD-10-CM | POA: Diagnosis not present

## 2017-11-30 MED ORDER — METOPROLOL SUCCINATE ER 50 MG PO TB24: 50.0000 mg | ORAL_TABLET | Freq: Every day | ORAL | 3 refills | Status: DC

## 2017-11-30 NOTE — Patient Instructions (Signed)
Send me a MyChart message in 1 month to update me on BP INCREASE to 50mg  daily- 2 of what you have at home and 1 of the new prescription Drink plenty of fluids Limit your salt intake Continue to work on stress management Call with any questions or concerns Have a great summer!!

## 2017-11-30 NOTE — Progress Notes (Signed)
   Subjective:    Patient ID: Jackie Mcconnell, female    DOB: June 09, 1971, 47 y.o.   MRN: [id number]  HPI HTN- started on Metoprolol at last visit.  BP again looks good but pt feels this is incorrect.  Pt reports electronic readings are consistently in the 130s.  No CP, SOB, HAs, visual changes, edema.  Repeat BP was 134/86.  Pt reports that she will get a 'heartbeat in my ear' when BP is high.  She is having less of that since starting Metoprolol.  Pt feels the metoprolol has also improved stress.   Review of Systems For ROS see HPI     Objective:   Physical Exam  Constitutional: She is oriented to person, place, and time. She appears well-developed and well-nourished. No distress.  obese  HENT:  Head: Normocephalic and atraumatic.  Eyes: Pupils are equal, round, and reactive to light. Conjunctivae and EOM are normal.  Neck: Normal range of motion. Neck supple. No thyromegaly present.  Cardiovascular: Normal rate, regular rhythm, normal heart sounds and intact distal pulses.  No murmur heard. Pulmonary/Chest: Effort normal and breath sounds normal. No respiratory distress.  Abdominal: Soft. She exhibits no distension. There is no tenderness.  Musculoskeletal: She exhibits no edema.  Lymphadenopathy:    She has no cervical adenopathy.  Neurological: She is alert and oriented to person, place, and time.  Skin: Skin is warm and dry.  Psychiatric: She has a normal mood and affect. Her behavior is normal.  Vitals reviewed.         Assessment & Plan:

## 2017-11-30 NOTE — Assessment & Plan Note (Signed)
Ongoing issue.  Pt again feels her BP reading was inaccurate in office bc it runs higher than that at home and at work.  HR remains elevated.  She feels that starting the medication has improved both BP and anxiety.  Will increase Metoprolol to 50mg  daily and monitor for further improvement.  Pt expressed understanding and is in agreement w/ plan.

## 2017-12-17 MED FILL — METOPROLOL SUCCINATE ER 25: 25 | 30 days supply | Qty: 30 | Fill #2

## 2017-12-28 MED FILL — PANTOPRAZOLE SOD DR 40 MG T: 40 | 30 days supply | Qty: 30 | Fill #1

## 2018-01-10 MED FILL — ALPRAZolam 0.5 MG TABS: 0.5 | 15 days supply | Qty: 30 | Fill #1

## 2018-01-10 MED FILL — METOPROLOL SUCCINATE ER 25: 25 | 30 days supply | Qty: 30 | Fill #3

## 2018-01-29 MED FILL — PANTOPRAZOLE SOD DR 40 MG T: 40 | 30 days supply | Qty: 30 | Fill #2

## 2018-02-04 ENCOUNTER — Encounter: Payer: Self-pay | Admitting: Family Medicine

## 2018-02-04 MED ORDER — METOPROLOL SUCCINATE ER 50 MG PO TB24: 50.0000 mg | ORAL_TABLET | Freq: Every day | ORAL | 3 refills | Status: DC

## 2018-02-04 MED FILL — METOPROLOL SUCCINATE ER 50: 50 | 30 days supply | Qty: 30 | Fill #0

## 2018-02-20 ENCOUNTER — Other Ambulatory Visit: Payer: Self-pay | Admitting: Family Medicine

## 2018-02-20 DIAGNOSIS — Z1231 Encounter for screening mammogram for malignant neoplasm of breast: Secondary | ICD-10-CM

## 2018-02-27 DIAGNOSIS — H524 Presbyopia: Secondary | ICD-10-CM | POA: Diagnosis not present

## 2018-03-01 ENCOUNTER — Other Ambulatory Visit: Payer: Self-pay | Admitting: Family Medicine

## 2018-03-01 MED FILL — METOPROLOL SUCCINATE ER 50: 50 | 90 days supply | Qty: 90 | Fill #1

## 2018-03-01 MED FILL — ALPRAZolam 0.5 MG TABS: 0.5 | 15 days supply | Qty: 30 | Fill #0

## 2018-03-01 MED FILL — ESTRADIOL 0.5 MG TABLET: 0.5 | 90 days supply | Qty: 90 | Fill #1

## 2018-03-01 MED FILL — PANTOPRAZOLE SOD DR 40 MG T: 40 | 90 days supply | Qty: 90 | Fill #3

## 2018-03-01 NOTE — Telephone Encounter (Signed)
Last OV 11/30/17, No future OV  Last filled 08/09/17, # 30 with 3 refills

## 2018-03-04 NOTE — Telephone Encounter (Signed)
Please advise, Dr. Birdie Riddle filled this medication on 03/01/18 #30 with 6. Received a fax from pharmacy advising pt would prefer a #90 prescription. Ok to change?

## 2018-03-04 NOTE — Telephone Encounter (Signed)
Would have to wait on Dr. Birdie Riddle to approve 90-day of controlled. She will have to stick with 30-day for now which is what I believe [facility] prefers.

## 2018-03-07 MED ORDER — ALPRAZOLAM 0.5 MG PO TABS: ORAL_TABLET | ORAL | 1 refills | Status: DC

## 2018-03-07 NOTE — Telephone Encounter (Signed)
Ok for #90, 1 refill 

## 2018-03-07 NOTE — Addendum Note (Signed)
Addended by: Davis Gourd on: 03/07/2018 08:19 AM   Modules accepted: Orders

## 2018-03-14 ENCOUNTER — Encounter: Payer: Self-pay | Admitting: Family Medicine

## 2018-03-14 MED ORDER — CYCLOBENZAPRINE HCL 5 MG PO TABS: 5.0000 mg | ORAL_TABLET | Freq: Three times a day (TID) | ORAL | 1 refills | Status: DC | PRN

## 2018-03-14 MED ORDER — TRAMADOL HCL 50 MG PO TABS: 50.0000 mg | ORAL_TABLET | Freq: Two times a day (BID) | ORAL | 0 refills | Status: DC | PRN

## 2018-03-14 MED FILL — CYCLOBENZAPRINE HCL 5 MG TA: 5 | 10 days supply | Qty: 30 | Fill #0

## 2018-03-14 MED FILL — traMADol HCL 50 MG TABS: 50 | 15 days supply | Qty: 30 | Fill #0

## 2018-03-14 NOTE — Telephone Encounter (Signed)
Last OV 11/30/17 Tramadol and flexeril were last filled by historical provider.

## 2018-03-29 ENCOUNTER — Ambulatory Visit
Admission: RE | Admit: 2018-03-29 | Discharge: 2018-03-29 | Disposition: A | Discharge status: Home / Self Care | Payer: 59 | Source: Ambulatory Visit | Attending: Family Medicine | Admitting: Family Medicine

## 2018-03-29 DIAGNOSIS — Z1231 Encounter for screening mammogram for malignant neoplasm of breast: Secondary | ICD-10-CM

## 2018-03-29 MED FILL — CYCLOBENZAPRINE HCL 5 MG TA: 5 | 10 days supply | Qty: 30 | Fill #0

## 2018-03-29 MED FILL — traMADol HCL 50 MG TABS: 50 | 15 days supply | Qty: 30 | Fill #0

## 2018-05-28 ENCOUNTER — Other Ambulatory Visit: Payer: Self-pay | Admitting: Family Medicine

## 2018-05-28 MED FILL — PANTOPRAZOLE SOD DR 40 MG T: 40 | 30 days supply | Qty: 30 | Fill #4

## 2018-05-28 MED FILL — ALPRAZolam 0.5 MG TABS: 0.5 | 15 days supply | Qty: 30 | Fill #1

## 2018-05-28 MED FILL — ESTRADIOL 0.5 MG TABLET: 0.5 | 90 days supply | Qty: 90 | Fill #0

## 2018-05-30 DIAGNOSIS — M25531 Pain in right wrist: Secondary | ICD-10-CM | POA: Diagnosis not present

## 2018-05-30 DIAGNOSIS — M542 Cervicalgia: Secondary | ICD-10-CM | POA: Diagnosis not present

## 2018-06-27 ENCOUNTER — Other Ambulatory Visit: Payer: Self-pay | Admitting: Family Medicine

## 2018-06-27 MED FILL — METOPROLOL SUCCINATE ER 50: 50 | 30 days supply | Qty: 30 | Fill #0

## 2018-06-27 MED FILL — PANTOPRAZOLE SOD DR 40 MG T: 40 | 90 days supply | Qty: 90 | Fill #0

## 2018-07-11 DIAGNOSIS — T1501XA Foreign body in cornea, right eye, initial encounter: Secondary | ICD-10-CM | POA: Diagnosis not present

## 2018-07-11 MED FILL — CIPROFLOXACIN 0.3% EYE DRO: 0.3 | 12 days supply | Qty: 5 | Fill #0

## 2018-07-11 MED FILL — PREDNISOLONE AC 1% EYE DROP: 1 | 12 days supply | Qty: 5 | Fill #0

## 2018-07-12 ENCOUNTER — Ambulatory Visit: Payer: 59 | Attending: Orthopedic Surgery | Admitting: Occupational Therapy

## 2018-07-12 ENCOUNTER — Ambulatory Visit: Payer: 59 | Admitting: Physical Therapy

## 2018-07-12 ENCOUNTER — Encounter: Payer: Self-pay | Admitting: Physical Therapy

## 2018-07-12 ENCOUNTER — Other Ambulatory Visit: Payer: Self-pay

## 2018-07-12 DIAGNOSIS — R293 Abnormal posture: Secondary | ICD-10-CM | POA: Insufficient documentation

## 2018-07-12 DIAGNOSIS — M79631 Pain in right forearm: Secondary | ICD-10-CM | POA: Insufficient documentation

## 2018-07-12 DIAGNOSIS — M542 Cervicalgia: Secondary | ICD-10-CM

## 2018-07-12 DIAGNOSIS — M62838 Other muscle spasm: Secondary | ICD-10-CM | POA: Diagnosis not present

## 2018-07-12 DIAGNOSIS — M25531 Pain in right wrist: Secondary | ICD-10-CM | POA: Insufficient documentation

## 2018-07-12 DIAGNOSIS — R600 Localized edema: Secondary | ICD-10-CM | POA: Diagnosis not present

## 2018-07-12 DIAGNOSIS — M6281 Muscle weakness (generalized): Secondary | ICD-10-CM | POA: Insufficient documentation

## 2018-07-12 NOTE — Patient Instructions (Addendum)
  Active Neck Rotation    After warming your muscles (shower or heating pad), lie down and with head in a comfortable position and chin gently tucked in, rotate head to the right to the point of a mild stretch. Hold __30__ seconds. Repeat to the left. Repeat ___3_ times. Do __2__ sessions per day.    Access Code: [email or web address]  URL: [email or web address]  Date: 07/12/2018  Prepared by: Barry Brunner   Exercises  Seated Upper Trapezius Stretch - 2 reps - 1 sets - 30 hold - 2x daily - 7x weekly  Patient Education  Office Posture  Sleep Positions

## 2018-07-12 NOTE — Therapy (Signed)
[facility] [address] [place], [place], [address] Phone: [phone]   Fax:  [phone]  Physical Therapy Evaluation  Patient Details  Name: Jackie Mcconnell MRN: [id number] Date of Birth: [birth year] Referring Provider (PT): Roseanne Kaufman, MD   Encounter Date: 07/12/2018  PT End of Session - 07/12/18 1654    Visit Number  1    Number of Visits  9    Date for PT Re-Evaluation  08/23/18    Authorization Type  [facility]    PT Start Time  1194    PT Stop Time  1620    PT Time Calculation (min)  46 min    Activity Tolerance  Patient tolerated treatment well    Behavior During Therapy  [facility] for tasks assessed/performed       Past Medical History:  Diagnosis Date  . Anxiety    due to school studies  . Complication of anesthesia   . GERD (gastroesophageal reflux disease) 08/08/2017  . History of kidney disease   . Hypertension   . PONV (postoperative nausea and vomiting)     Past Surgical History:  Procedure Laterality Date  . ABDOMINAL HYSTERECTOMY     complete 2011  . NEPHRECTOMY Left 04/06/2014   Procedure: NEPHRECTOMY;  Surgeon: Raynelle Bring, MD;  Location: [facility];  Service: Urology;  Laterality: Left;    There were no vitals filed for this visit.   Subjective Assessment - 07/12/18 1539    Subjective  Neck pain began 2-3 yrs and has gotten progressively worse. Has not done PT but did try massage therapy and that helped.     Pertinent History  HTN, nephrectomy, obesity    Diagnostic tests  none    Patient Stated Goals  to decrease neck pain    Currently in Pain?  Yes    Pain Score  3     Pain Location  Neck    Pain Orientation  Right    Pain Descriptors / Indicators  Spasm;Sharp;Aching   sharp when turns to the right   Pain Type  Chronic pain    Pain Radiating Towards  between shoulder blades    Pain Onset  More than a month ago    Pain Frequency  Constant    Aggravating Factors   data  entry, sleeping wrong    Pain Relieving Factors  medicine (pain and muscle relaxer), heat, bio-freeze    Effect of Pain on Daily Activities  has had to leave work early due to pain         [facility] PT Assessment - 07/12/18 1543      Assessment   Medical Diagnosis  cervicalgia    Referring Provider (PT)  Roseanne Kaufman, MD    Onset Date/Surgical Date  --   referral 06/20/18; states pain began 2-3 yrs ago   Hand Dominance  Right    Prior Therapy  none      Prior Function   Level of Independence  Independent    Vocation  Full time employment    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    Leisure  snow skiiing      ROM / Strength   AROM / PROM / Strength  AROM      AROM   Overall AROM   Deficits    AROM Assessment Site  Cervical    Cervical Flexion  55   incr pain    Cervical Extension  45  Cervical - Right Side Bend  40    Cervical - Left Side Bend  40    Cervical - Right Rotation  68    Cervical - Left Rotation  58      Palpation   Palpation comment  band of tightness medial to right scapula (palpable with rt shoulder horiz adduction); cervical spine in alignment with no point tenderness      Special Tests    Special Tests  Cervical    Cervical Tests  Spurling's;Dictraction;other      Spurling's   Findings  Negative      Distraction Test   Findngs  Positive    Comment  reports pain decreased with traction; felt heaviness in forearm with traction released      other    Findings  Positive    Side  Right    Comment  RUE neural tension test + for tight feeling in forearm (especially with end step of left lateral cervical flexion)                Objective measurements completed on examination: See above findings.      [facility] Adult PT Treatment/Exercise - 07/12/18 1645      Neck Exercises: Stretches   Upper Trapezius Stretch  Right;Left;2 reps;30 seconds    Upper Trapezius Stretch Limitations  seated, plus added cervical rotation looking down  towards floor    Other Neck Stretches  supine cervical rotation, no pillow or folded towel    Other Neck Stretches  supine right horizontal shoulder adduction             PT Education - 07/12/18 1635    Education Details  results of eval and PT POC; initiated HEP    Person(s) Educated  Patient    Methods  Explanation;Demonstration;Verbal cues;Handout    Comprehension  Verbalized understanding;Returned demonstration;Verbal cues required;Need further instruction       PT Short Term Goals - 07/12/18 1704      PT SHORT TERM GOAL #1   Title  Patient will be independent with basic HEP (target 4 visits)      PT SHORT TERM GOAL #2   Title  Patient will report neck pain <=4/10 during work hours with use of stretches and ergonomic/posture training.         PT Long Term Goals - 07/12/18 1706      PT LONG TERM GOAL #1   Title  Patient will improve cervical ROM in all planes to WNL with no pain (mild stretch, OK)  (Target all LTGs 8th visit)    Status  New      PT LONG TERM GOAL #2   Title  Patient will report no longer awakening during night due to neck pain.     Status  New      PT LONG TERM GOAL #3   Title  Patient will be independent with updated HEP, including able to verbalize need to continue stretches/changes in position throughout her work day.     Status  New             Plan - 07/12/18 1655    Clinical Impression Statement  Patient referred to OPPT by Dr. Amedeo Plenty for cervicalgia (also referred to OT for rt forearm pain). Patient has had symptoms for up to 3 years and has not tried PT for this episode. She has had PT in the distant past for pain in thoracic spine (which she responded well to). 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 Patient can benefit from the PT interventions listed below to address the deficits listed below.     History and Personal Factors relevant to plan of care:  PMH-HTN, nephrectomy, obesity     Clinical Presentation  Stable    Clinical Presentation due to:  ongoing issue for 2-3 yrs; no numbness or tingling    Clinical Decision Making  Low    Rehab Potential  Good    PT Frequency  Other (comment)   2x/week for 2 weeks; 1x/wk for 4 weeks   PT Duration  6 weeks    PT Treatment/Interventions  ADLs/Self Care Home Management;Electrical Stimulation;Ultrasound;Traction;Moist Heat;Therapeutic exercise;Patient/family education;Manual techniques;Dry needling;Passive range of motion    PT Next Visit Plan  check technique of HEP; any ?'s related to handout given re: work/desk ergonomics (see MedBridge); instruct in chin tuck (supine then sit), shoulder rolls; supine scapular protraction with red band; seated rowing with red band; manual therapy as needed    Consulted and Agree with Plan of Care  Patient       Patient will benefit from skilled therapeutic intervention in order to improve the following deficits and impairments:  Decreased range of motion, Increased fascial restricitons, Increased muscle spasms, Impaired flexibility, Postural dysfunction, Pain  Visit Diagnosis: Cervicalgia - Plan: PT plan of care cert/re-cert  Other muscle spasm - Plan: PT plan of care cert/re-cert  Abnormal posture - Plan: PT plan of care cert/re-cert     Problem List Patient Active Problem List   Diagnosis Date Noted  . Benign cystic nephroma 08/08/2017  . GERD (gastroesophageal reflux disease) 08/08/2017  . Labile blood pressure 08/08/2017  . Bloating 08/08/2017  . Physical exam 10/27/2016  . History of renal hypertension 12/20/2015  . Anxiety state 12/20/2015  . Obesity (BMI 35.0-39.9 without comorbidity) ([place]) 12/02/2013    Rexanne Mano, PT 07/12/2018, 5:17 PM  [facility] [address] [place], [place], [address] Phone: [phone]   Fax:  [phone]  Name: Jackie Mcconnell MRN: [id number] Date of Birth: 07/13/1970

## 2018-07-12 NOTE — Therapy (Signed)
[facility] [address] [place], [place], [address] Phone: [phone]   Fax:  [phone]  Occupational Therapy Evaluation  Patient Details  Name: Jackie Mcconnell MRN: [id number] Date of Birth: Jul 27, 1970 Referring Provider (OT): 06/20/18   Encounter Date: 07/12/2018  OT End of Session - 07/12/18 1548    Visit Number  1    Number of Visits  9    Date for OT Re-Evaluation  09/10/18    Authorization Type  [facility]     Authorization Time Period  12 weeks    OT Start Time  1450    OT Stop Time  1530    OT Time Calculation (min)  40 min    Activity Tolerance  Patient tolerated treatment well    Behavior During Therapy  [facility] for tasks assessed/performed       Past Medical History:  Diagnosis Date  . Anxiety    due to school studies  . Complication of anesthesia   . GERD (gastroesophageal reflux disease) 08/08/2017  . History of kidney disease   . Hypertension   . PONV (postoperative nausea and vomiting)     Past Surgical History:  Procedure Laterality Date  . ABDOMINAL HYSTERECTOMY     complete 2011  . NEPHRECTOMY Left 04/06/2014   Procedure: NEPHRECTOMY;  Surgeon: Raynelle Bring, MD;  Location: [facility];  Service: Urology;  Laterality: Left;    There were no vitals filed for this visit.  Subjective Assessment - 07/12/18 1611    Subjective   Pt reports forearm and wrist pain for several years    Pertinent History  HTN, kidney disease, left kidney removed, R forearm musculoskeletal myalgias    Patient Stated Goals  decrease pain    Currently in Pain?  Yes    Pain Score  3     Pain Location  --   forearm, pain gets as high as 7/10 at work   Pain Orientation  Right    Pain Descriptors / Indicators  Aching    Pain Onset  More than a month ago    Pain Frequency  Intermittent    Aggravating Factors   overuse, data entry    Pain Relieving Factors  rest, ice        [facility] OT Assessment - 07/12/18 1604      Assessment   Medical Diagnosis  right forearm pain -musculoskeletal myalgias    Referring Provider (OT)  Dr. Amedeo Plenty,  referral date 06/20/18   Prior Therapy  none      Precautions   Precaution Comments  pain      Home  Environment   Family/patient expects to be discharged to:  Private residence    Lives With  [place]  Full time employment    Vocation Requirements  data entry [employer]      ADL   ADL comments  I with all basic ADLS. Pt reprots pain with data entry/ mouse use   Difficulty gardening due to pain     IADL   Meal Prep  Plans, prepares and serves adequate meals independently   reports pain with chopping vegetables, lifting pans     Mobility   Mobility Status  Independent      Written Expression   Dominant Hand  Right      Cognition   Overall Cognitive Status  Within Functional Limits for  tasks assessed      Sensation   Light Touch  Appears Intact    Additional Comments  reports intermittant numbness in forearm at times      ROM / Strength   AROM / PROM / Strength  AROM      Palpation   Palpation comment  tightness noted along dorsal right forearm with some point tenderness, knots presents      AROM   Overall AROM   Within functional limits for tasks performed    Overall AROM Comments  RUE wrist flexion/ extension: 70/60, ulnar/radial deviation: 35/30, supination / pronation WFLS.      Hand Function   Right Hand Grip (lbs)  56    Left Hand Grip (lbs)  60               OT Treatments/Exercises (OP) - 07/12/18 0001      Modalities   Modalities  Ultrasound      Ultrasound   Ultrasound Location  right dorsal forearm    Ultrasound Parameters  3Mhz, 0.8 w/cm2 , 20% x 8 mins    Ultrasound Goals  Edema;Pain      Manual Therapy   Manual Therapy  Soft tissue mobilization    Soft tissue mobilization  soft tissue mobs and massage to dorsal forearm, ice pack applied afterwards x  6 mins, no adverse reactions              OT Short Term Goals - 07/12/18 1559      OT SHORT TERM GOAL #1   Title  I with initial HEP.    Time  4    Period  Weeks    Status  New      OT SHORT TERM GOAL #2   Title  Pt will verbalize understanding of pain reduction strategies(ice, brace, stretches, positioning)    Time  4    Period  Weeks    Status  New        OT Long Term Goals - 07/12/18 1600      OT LONG TERM GOAL #1   Title  I with updated HEP.    Time  8    Period  Weeks    Status  New      OT LONG TERM GOAL #2   Title  Pt will report performing work activities with pain consistently less than or equal to 3/10.    Time  8    Period  Weeks    Status  New      OT LONG TERM GOAL #3   Title  Pt will verbalize understanding of AE/adapted strategies for ADLs/IADLs to minimize pain.    Time  8    Period  Weeks    Status  New            Plan - 07/12/18 1549    Clinical Impression Statement  Pt is a 48 y.o female with wrist and forearm pain and musculoskelatal myalgias. Pt presents to occupational therapy with the following deficits: pain, decreased strength, decreased functional use and intermittant numbness. Pt is also referred to PT for neck pain.  Pt can benefit from skilled occupational therapy to maximize pt's indpendence with ADLS, IADLS and work activities.     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 Pt lives  with her wife. PMH: right forearm pain musculoskeletal myalgias, neck pain, HTN, kidney disease with left kidney removed    Occupational performance deficits (Please refer to evaluation for details):  ADL's;IADL's;Work    Rehab Potential  Good    Current Impairments/barriers affecting progress:  Pt reports inital onset several years ago,     OT Frequency   Other (comment)   8 visits plus eval, aniticpate d/c following 4-6 visits   OT Duration  8 weeks    OT Treatment/Interventions  Self-care/ADL training;Electrical Stimulation;Iontophoresis;Therapeutic exercise;Patient/family education;Neuromuscular education;Paraffin;Moist Heat;Aquatic Therapy;Fluidtherapy;Energy conservation;Scar mobilization;Therapeutic activities;Passive range of motion;Manual Therapy;DME and/or AE instruction;Contrast Bath;Ultrasound;Cryotherapy    Plan  continue [place], soft tissue mobs/ massage, inital stretching program, assess splints    Clinical Decision Making  Limited treatment options, no task modification necessary    Consulted and Agree with Plan of Care  Patient       Patient will benefit from skilled therapeutic intervention in order to improve the following deficits and impairments:  Impaired flexibility, Pain, Impaired sensation, Decreased mobility, Decreased strength, Decreased range of motion, Decreased activity tolerance, Decreased endurance, Impaired perceived functional ability  Visit Diagnosis: Pain in right forearm - Plan: Ot plan of care cert/re-cert  Pain in right wrist - Plan: Ot plan of care cert/re-cert  Localized edema - Plan: Ot plan of care cert/re-cert  Muscle weakness (generalized) - Plan: Ot plan of care cert/re-cert    Problem List Patient Active Problem List   Diagnosis Date Noted  . Benign cystic nephroma 08/08/2017  . GERD (gastroesophageal reflux disease) 08/08/2017  . Labile blood pressure 08/08/2017  . Bloating 08/08/2017  . Physical exam 10/27/2016  . History of renal hypertension 12/20/2015  . Anxiety state 12/20/2015  . Obesity (BMI 35.0-39.9 without comorbidity) ([place]) 12/02/2013    RINE,KATHRYN 07/12/2018, 4:21 PM Theone Murdoch, OTR/L [phone] Phone: [phone] 4:21 PM 07/12/18 [facility] [address] [place], [place], [address] Phone:  [phone]   Fax:  [phone]  Name: Jackie Mcconnell MRN: [id number] Date of Birth: [birth year]

## 2018-07-16 ENCOUNTER — Ambulatory Visit: Payer: 59 | Admitting: Occupational Therapy

## 2018-07-19 ENCOUNTER — Ambulatory Visit: Payer: 59 | Admitting: Physical Therapy

## 2018-07-19 DIAGNOSIS — R293 Abnormal posture: Secondary | ICD-10-CM

## 2018-07-19 DIAGNOSIS — M79631 Pain in right forearm: Secondary | ICD-10-CM | POA: Diagnosis not present

## 2018-07-19 DIAGNOSIS — M62838 Other muscle spasm: Secondary | ICD-10-CM | POA: Diagnosis not present

## 2018-07-19 DIAGNOSIS — M25531 Pain in right wrist: Secondary | ICD-10-CM | POA: Diagnosis not present

## 2018-07-19 DIAGNOSIS — M542 Cervicalgia: Secondary | ICD-10-CM

## 2018-07-19 DIAGNOSIS — R600 Localized edema: Secondary | ICD-10-CM | POA: Diagnosis not present

## 2018-07-19 DIAGNOSIS — M6281 Muscle weakness (generalized): Secondary | ICD-10-CM | POA: Diagnosis not present

## 2018-07-19 NOTE — Therapy (Signed)
[facility] [address] [place], [place], [address] Phone: [phone]   Fax:  [phone]  Physical Therapy Treatment  Patient Details  Name: Jackie Mcconnell MRN: [id number] Date of Birth: January 22, 1971 Referring Provider (PT): Roseanne Kaufman, MD   Encounter Date: 07/19/2018  PT End of Session - 07/19/18 1322    Visit Number  2    Number of Visits  9    Date for PT Re-Evaluation  08/23/18    Authorization Type  [facility]    PT Start Time  5361    PT Stop Time  1022    PT Time Calculation (min)  43 min    Activity Tolerance  Patient tolerated treatment well    Behavior During Therapy  [facility] for tasks assessed/performed       Past Medical History:  Diagnosis Date  . Anxiety    due to school studies  . Complication of anesthesia   . GERD (gastroesophageal reflux disease) 08/08/2017  . History of kidney disease   . Hypertension   . PONV (postoperative nausea and vomiting)     Past Surgical History:  Procedure Laterality Date  . ABDOMINAL HYSTERECTOMY     complete 2011  . NEPHRECTOMY Left 04/06/2014   Procedure: NEPHRECTOMY;  Surgeon: Raynelle Bring, MD;  Location: [facility];  Service: Urology;  Laterality: Left;    There were no vitals filed for this visit.  Subjective Assessment - 07/19/18 0951    Subjective  Has not made changes to work station yet.  Feels about the same as last visit.  Yesterday was very stressful and neck was very painful yesterday.  Feels better today but had a hot shower.    Pertinent History  HTN, nephrectomy, obesity    Diagnostic tests  none    Patient Stated Goals  to decrease neck pain    Currently in Pain?  Yes    Pain Score  3     Pain Location  Neck    Pain Orientation  Posterior;Medial    Pain Descriptors / Indicators  Sore;Discomfort    Pain Onset  More than a month ago                       [facility] Adult PT Treatment/Exercise - 07/19/18 1312      Therapeutic Activites    Therapeutic Activities  Other Therapeutic Activities    Other Therapeutic Activities  Provided pt with hand out regarding TDN; education reviewed regarding dry needling and risk of pneumothorax when performing TDN over lung field.  Discussed other risks, symptoms of pneumothorax and if she experiences symptoms to proceed to ED      Exercises   Exercises  Neck      Shoulder Exercises: Seated   Other Seated Exercises  alternating posterior shoulder rolls x 5 reps each side      Neck Exercises: Stretches   Upper Trapezius Stretch  Right;30 seconds;1 rep    Upper Trapezius Stretch Limitations  noted to have increased rotation to L as compared to rotation to R following treatment        Trigger Point Dry Needling - 07/19/18 1139    Consent Given?  Yes    Education Handout Provided  Yes    Muscles Treated Upper Body  Upper trapezius;Rhomboids    Upper Trapezius Response  Twitch reponse elicited;Palpable increased muscle length    Rhomboids Response  Twitch response elicited  PT Education - 07/19/18 1322    Education Details  TDN and what is a trigger point, stretches and heat to manage soreness and maintain ROM    Person(s) Educated  Patient    Methods  Explanation;Demonstration;Handout    Comprehension  Verbalized understanding;Returned demonstration       PT Short Term Goals - 07/12/18 1704      PT SHORT TERM GOAL #1   Title  Patient will be independent with basic HEP (target 4 visits)      PT SHORT TERM GOAL #2   Title  Patient will report neck pain <=4/10 during work hours with use of stretches and ergonomic/posture training.         PT Long Term Goals - 07/12/18 1706      PT LONG TERM GOAL #1   Title  Patient will improve cervical ROM in all planes to WNL with no pain (mild stretch, OK)  (Target all LTGs 8th visit)    Status  New      PT LONG TERM GOAL #2   Title  Patient will report no longer awakening during night due to neck  pain.     Status  New      PT LONG TERM GOAL #3   Title  Patient will be independent with updated HEP, including able to verbalize need to continue stretches/changes in position throughout her work day.     Status  New            Plan - 07/19/18 1323    Clinical Impression Statement  Treatment session focused on use of trigger point dry needling and stretching of R upper trapezius and TDN to R rhomboid to increase ROM, decrease tension and pain.  Pt reported referred pain to shoulder during rhomboid TDN and decreased tenderness to palpation following treatment.  Pt noted to have increase in rotation to L following treatment of R upper trap.  Still has limited rotation to R and may benefit from another treatment of TDN to L upper trapezius.  Will continue to adddress and progress towards LTG.    Rehab Potential  Good    PT Frequency  Other (comment)   2x/week for 2 weeks; 1x/wk for 4 weeks   PT Duration  6 weeks    PT Treatment/Interventions  ADLs/Self Care Home Management;Electrical Stimulation;Ultrasound;Traction;Moist Heat;Therapeutic exercise;Patient/family education;Manual techniques;Dry needling;Passive range of motion    PT Next Visit Plan  How did she feel after dry needling?  Is she interested in dry needling on L upper trap?   If so - lets see if there is a session we can switch.  check technique of HEP; any ?'s related to handout given re: work/desk ergonomics (see MedBridge); instruct in chin tuck (supine then sit), shoulder rolls; supine scapular protraction with red band; seated rowing with red band; manual therapy as needed    Consulted and Agree with Plan of Care  Patient       Patient will benefit from skilled therapeutic intervention in order to improve the following deficits and impairments:  Decreased range of motion, Increased fascial restricitons, Increased muscle spasms, Impaired flexibility, Postural dysfunction, Pain  Visit Diagnosis: Cervicalgia  Abnormal  posture     Problem List Patient Active Problem List   Diagnosis Date Noted  . Benign cystic nephroma 08/08/2017  . GERD (gastroesophageal reflux disease) 08/08/2017  . Labile blood pressure 08/08/2017  . Bloating 08/08/2017  . Physical exam 10/27/2016  . History of renal hypertension 12/20/2015  .  Anxiety state 12/20/2015  . Obesity (BMI 35.0-39.9 without comorbidity) ([place]) 12/02/2013   Rico Junker, PT, DPT 07/19/18    1:28 PM    [facility] [address] [place], [place], [address] Phone: [phone]   Fax:  [phone]  Name: WRENLY LAURITSEN MRN: [id number] Date of Birth: May 27, 1971

## 2018-07-19 NOTE — Patient Instructions (Signed)

## 2018-07-23 ENCOUNTER — Encounter: Payer: Self-pay | Admitting: Physical Therapy

## 2018-07-23 ENCOUNTER — Ambulatory Visit: Payer: 59 | Admitting: Physical Therapy

## 2018-07-23 DIAGNOSIS — M25531 Pain in right wrist: Secondary | ICD-10-CM | POA: Diagnosis not present

## 2018-07-23 DIAGNOSIS — M62838 Other muscle spasm: Secondary | ICD-10-CM

## 2018-07-23 DIAGNOSIS — M79631 Pain in right forearm: Secondary | ICD-10-CM | POA: Diagnosis not present

## 2018-07-23 DIAGNOSIS — M542 Cervicalgia: Secondary | ICD-10-CM | POA: Diagnosis not present

## 2018-07-23 DIAGNOSIS — R600 Localized edema: Secondary | ICD-10-CM | POA: Diagnosis not present

## 2018-07-23 DIAGNOSIS — M6281 Muscle weakness (generalized): Secondary | ICD-10-CM | POA: Diagnosis not present

## 2018-07-23 DIAGNOSIS — R293 Abnormal posture: Secondary | ICD-10-CM

## 2018-07-23 NOTE — Therapy (Signed)
[facility] [address] [place], [place], [address] Phone: [phone]   Fax:  [phone]  Physical Therapy Treatment  Patient Details  Name: Jackie Mcconnell MRN: [id number] Date of Birth: 03-23-1971 Referring Provider (PT): Roseanne Kaufman, MD   Encounter Date: 07/23/2018  PT End of Session - 07/23/18 0807    Visit Number  3    Number of Visits  9    Date for PT Re-Evaluation  08/23/18    Authorization Type  [facility]    PT Start Time  1017    PT Stop Time  0848    PT Time Calculation (min)  41 min    Activity Tolerance  Patient tolerated treatment well    Behavior During Therapy  [facility] for tasks assessed/performed       Past Medical History:  Diagnosis Date  . Anxiety    due to school studies  . Complication of anesthesia   . GERD (gastroesophageal reflux disease) 08/08/2017  . History of kidney disease   . Hypertension   . PONV (postoperative nausea and vomiting)     Past Surgical History:  Procedure Laterality Date  . ABDOMINAL HYSTERECTOMY     complete 2011  . NEPHRECTOMY Left 04/06/2014   Procedure: NEPHRECTOMY;  Surgeon: Raynelle Bring, MD;  Location: [facility];  Service: Urology;  Laterality: Left;    There were no vitals filed for this visit.  Subjective Assessment - 07/23/18 0808    Subjective  Was very sore Friday after DN on Thursday. By Sat was better and Sun noticed increased ROM to the right     Pertinent History  HTN, nephrectomy, obesity    Diagnostic tests  none    Patient Stated Goals  to decrease neck pain    Currently in Pain?  Yes    Pain Score  2     Pain Location  Neck    Pain Orientation  Right    Pain Descriptors / Indicators  Discomfort;Sore    Pain Radiating Towards  down between shoulder blades    Pain Onset  More than a month ago    Pain Frequency  Intermittent    Aggravating Factors   data entry    Pain Relieving Factors  rest ice stretches                        [facility] Adult PT Treatment/Exercise - 07/23/18 1708      Self-Care   Self-Care  Other Self-Care Comments    Other Self-Care Comments   Education on sleeping positions to decrease neck pain      Neck Exercises: Theraband   Other Theraband Exercises  supine scapula protraction with red band; 10 reps, 5 sec hold, 2 sets      Neck Exercises: Seated   Shoulder Rolls  Backwards;5 reps   2 sets     Neck Exercises: Supine   Neck Retraction  10 reps;5 secs    Neck Retraction Limitations  one pillow under head (not neck)       Manual Therapy   Manual Therapy  Soft tissue mobilization    Manual therapy comments  in left sidelying: palpable trigger points in rt rhomboid and/or middle trapezius with deep pressure applied with some release palpable; PROM rt scapular retraction and protraction followed by contract relax with passive stretch into scapula protraction             PT Education -  07/23/18 1338    Education Details  additions to HEP; try sleeping on left side with rt arm "hugging" a pillow for gentle stretch to rt scapular adductors    Person(s) Educated  Patient    Methods  Explanation;Demonstration;Verbal cues;Handout    Comprehension  Verbalized understanding;Returned demonstration;Verbal cues required;Need further instruction       PT Short Term Goals - 07/12/18 1704      PT SHORT TERM GOAL #1   Title  Patient will be independent with basic HEP (target 4 visits)      PT SHORT TERM GOAL #2   Title  Patient will report neck pain <=4/10 during work hours with use of stretches and ergonomic/posture training.         PT Long Term Goals - 07/12/18 1706      PT LONG TERM GOAL #1   Title  Patient will improve cervical ROM in all planes to WNL with no pain (mild stretch, OK)  (Target all LTGs 8th visit)    Status  New      PT LONG TERM GOAL #2   Title  Patient will report no longer awakening during night due to neck pain.     Status   New      PT LONG TERM GOAL #3   Title  Patient will be independent with updated HEP, including able to verbalize need to continue stretches/changes in position throughout her work day.     Status  New            Plan - 07/23/18 1344    Clinical Impression Statement  treatment session focused on answering questions re: previously assigned HEP, educating on sleep positions to decr neck and uppper back pain, updating HEP, and manual therapy to address trigger points in rt paraspinals. Patient very engaged in process, asking good questions, and working on HEP. Can continue to benefit from PT. (Note: noted due to our clinic appt availability and pt's work schedule that she was not able to be scheduled for 2x/week for 2weeks and then 1x/week for 4 weeks. Will re-submit certification to MD requesting 8 visits in 8 weeks.     Rehab Potential  Good    PT Frequency  Other (comment)   8 visits over 8 weeks   PT Duration  8 weeks    PT Treatment/Interventions  ADLs/Self Care Home Management;Electrical Stimulation;Ultrasound;Traction;Moist Heat;Therapeutic exercise;Patient/family education;Manual techniques;Dry needling;Passive range of motion    PT Next Visit Plan  check STGs 4th visit; work on chin tuck in sit or stand (?in doorframe); then try standing serratus push with red band while maintaining posture at doorframe; try seated rowing with red band; manual therapy as needed (esp rt Levator)    Consulted and Agree with Plan of Care  Patient       Patient will benefit from skilled therapeutic intervention in order to improve the following deficits and impairments:  Decreased range of motion, Increased fascial restricitons, Increased muscle spasms, Impaired flexibility, Postural dysfunction, Pain  Visit Diagnosis: Cervicalgia - Plan: PT plan of care cert/re-cert  Abnormal posture - Plan: PT plan of care cert/re-cert  Other muscle spasm - Plan: PT plan of care cert/re-cert     Problem  List Patient Active Problem List   Diagnosis Date Noted  . Benign cystic nephroma 08/08/2017  . GERD (gastroesophageal reflux disease) 08/08/2017  . Labile blood pressure 08/08/2017  . Bloating 08/08/2017  . Physical exam 10/27/2016  . History of renal hypertension 12/20/2015  .  Anxiety state 12/20/2015  . Obesity (BMI 35.0-39.9 without comorbidity) ([place]) 12/02/2013    Rexanne Mano, PT 07/23/2018, 5:24 PM  [facility] [address] [place], [place], [address] Phone: [phone]   Fax:  [phone]  Name: Jackie Mcconnell MRN: [id number] Date of Birth: [birth year]

## 2018-07-23 NOTE — Patient Instructions (Addendum)
Access Code: [email or web address]  URL: [email or web address]  Date: 07/23/2018  Prepared by: Barry Brunner   Exercises  Seated Upper Trapezius Stretch - 2 reps - 1 sets - 30 hold - 2x daily - 7x weekly  Seated Shoulder Rolls - 3-5 reps - 1 sets - - hold - 1-10x daily - 7x weekly  Supine Serratus Punch with Resistance - 10 reps - 1 sets - 5 counts hold - 1x daily - 7x weekly  Patient Education  Office Posture  Sleep Positions

## 2018-07-25 ENCOUNTER — Ambulatory Visit: Payer: 59 | Admitting: Occupational Therapy

## 2018-07-25 DIAGNOSIS — R293 Abnormal posture: Secondary | ICD-10-CM

## 2018-07-25 DIAGNOSIS — M25531 Pain in right wrist: Secondary | ICD-10-CM | POA: Diagnosis not present

## 2018-07-25 DIAGNOSIS — M542 Cervicalgia: Secondary | ICD-10-CM | POA: Diagnosis not present

## 2018-07-25 DIAGNOSIS — M79631 Pain in right forearm: Secondary | ICD-10-CM

## 2018-07-25 DIAGNOSIS — R600 Localized edema: Secondary | ICD-10-CM | POA: Diagnosis not present

## 2018-07-25 DIAGNOSIS — M62838 Other muscle spasm: Secondary | ICD-10-CM | POA: Diagnosis not present

## 2018-07-25 DIAGNOSIS — M6281 Muscle weakness (generalized): Secondary | ICD-10-CM | POA: Diagnosis not present

## 2018-07-25 NOTE — Therapy (Signed)
[facility] [address] [place], [place], [address] Phone: [phone]   Fax:  [phone]  Occupational Therapy Treatment  Patient Details  Name: TRINITIE MCGIRR MRN: [id number] Date of Birth: 1970/07/11 Referring Provider (OT): 06/20/18   Encounter Date: 07/25/2018  OT End of Session - 07/25/18 1627    Visit Number  2    Number of Visits  9    Date for OT Re-Evaluation  09/10/18    Authorization Type  [facility]     Authorization Time Period  12 weeks    OT Start Time  1530    OT Stop Time  1620    OT Time Calculation (min)  50 min    Activity Tolerance  Patient tolerated treatment well    Behavior During Therapy  [facility] for tasks assessed/performed       Past Medical History:  Diagnosis Date  . Anxiety    due to school studies  . Complication of anesthesia   . GERD (gastroesophageal reflux disease) 08/08/2017  . History of kidney disease   . Hypertension   . PONV (postoperative nausea and vomiting)     Past Surgical History:  Procedure Laterality Date  . ABDOMINAL HYSTERECTOMY     complete 2011  . NEPHRECTOMY Left 04/06/2014   Procedure: NEPHRECTOMY;  Surgeon: Raynelle Bring, MD;  [facility];  Service: Urology;  Laterality: Left;    There were no vitals filed for this visit.  Subjective Assessment - 07/25/18 1623    Pertinent History  HTN, kidney disease, left kidney removed, R forearm musculoskeletal myalgias    Patient Stated Goals  decrease pain    Currently in Pain?  Yes    Pain Score  2     Pain Location  Generalized   at neck, Rt shoulder blade, and radial forearm   Pain Orientation  Right    Pain Descriptors / Indicators  Aching;Sore   occasional numbness radial forearm   Pain Type  Chronic pain    Pain Onset  More than a month ago    Pain Frequency  Intermittent    Aggravating Factors   work tasks    Pain Relieving Factors  rest, stretches       Pt brought in splints for assessment  (wrist cock ups) - therapist reviewed most common indications for wrist cock up splints which pt does not appear to have (does not appear to have CTS or lateral epicondylitis), however instructed pt she could wear brace during work if wrist is painful and it helps reduce pain. Long discussion re: proper positioning at workstation, stretching regularly throughout work day, and Designer, fashion/clothing.  Pt noted to have some hypermobility in Rt scapula indicating proximal weakness which may cause worsening pain distally.  Recommended pt set timer at work and every 30 minutes stretch for a minute working on trunk extension, scapula retraction and depression (pt tends to hike shoulders w/ scapula elevation), as well as forearm stretch in wrist flexion and extension. Also showed pt towel stretch in supine and recommended doing for 10 minutes when pt comes home from work.  Ultrasound x 8 min. 20% pulsed, 3 Mhz, 0.8 wts/cm2 to radial proximal forearm at side of tenderness/tightness.  Kinesiotape applied to radial forearm (to relax/inhibit muscle) and circumferentially around radial wrist w/ slight stretch on tape. Kinesiotape also applied to facilitate Rt scapula depression and trunk extension.  OT Short Term Goals - 07/12/18 1559      OT SHORT TERM GOAL #1   Title  I with initial HEP.    Time  4    Period  Weeks    Status  New      OT SHORT TERM GOAL #2   Title  Pt will verbalize understanding of pain reduction strategies(ice, brace, stretches, positioning)    Time  4    Period  Weeks    Status  New        OT Long Term Goals - 07/12/18 1600      OT LONG TERM GOAL #1   Title  I with updated HEP.    Time  8    Period  Weeks    Status  New      OT LONG TERM GOAL #2   Title  Pt will report performing work activities with pain consistently less than or equal to 3/10.    Time  8    Period  Weeks    Status  New      OT LONG TERM GOAL #3   Title  Pt will  verbalize understanding of AE/adapted strategies to minimize pain.    Time  8    Period  Weeks    Status  New            Plan - 07/25/18 1628    Clinical Impression Statement  Pt with increased awareness of proper positioning during work tasks, and benefits of stretching.     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 Pt lives with her wife. PMH: right forearm pain musculoskeletal myalgias, neck pain, HTN, kidney disease with left kidney removed    Occupational performance deficits (Please refer to evaluation for details):  ADL's;IADL's;Work    Rehab Potential  Good    Current Impairments/barriers affecting progress:  Pt reports inital onset several years ago,     OT Treatment/Interventions  Self-care/ADL training;Electrical Stimulation;Iontophoresis;Therapeutic exercise;Patient/family education;Neuromuscular education;Paraffin;Moist Heat;Aquatic Therapy;Fluidtherapy;Energy conservation;Scar mobilization;Therapeutic activities;Passive range of motion;Manual Therapy;DME and/or AE instruction;Contrast Bath;Ultrasound;Cryotherapy    Plan  consider scapula strenghening d/t hypermobility, continue [place] (try continuous), assess taping, manual therapy    Consulted and Agree with Plan of Care  Patient       Patient will benefit from skilled therapeutic intervention in order to improve the following deficits and impairments:  Impaired flexibility, Pain, Impaired sensation, Decreased mobility, Decreased strength, Decreased range of motion, Decreased activity tolerance, Decreased endurance, Impaired perceived functional ability  Visit Diagnosis: Abnormal posture  Pain in right forearm  Pain in right wrist  Muscle weakness (generalized)    Problem List Patient Active Problem List   Diagnosis Date  Noted  . Benign cystic nephroma 08/08/2017  . GERD (gastroesophageal reflux disease) 08/08/2017  . Labile blood pressure 08/08/2017  . Bloating 08/08/2017  . Physical exam 10/27/2016  . History of renal hypertension 12/20/2015  . Anxiety state 12/20/2015  . Obesity (BMI 35.0-39.9 without comorbidity) ([place]) 12/02/2013    Carey Bullocks, OTR/L 07/25/2018, 4:30 PM  [facility] [address] [place], [place], [address] Phone: [phone]   Fax:  [phone]  Name: TICHINA KOEBEL MRN: [id number] Date of Birth: [birth year]

## 2018-07-30 ENCOUNTER — Ambulatory Visit: Payer: 59 | Attending: Orthopedic Surgery | Admitting: Occupational Therapy

## 2018-07-30 DIAGNOSIS — M6281 Muscle weakness (generalized): Secondary | ICD-10-CM | POA: Insufficient documentation

## 2018-07-30 DIAGNOSIS — M542 Cervicalgia: Secondary | ICD-10-CM | POA: Insufficient documentation

## 2018-07-30 DIAGNOSIS — M79631 Pain in right forearm: Secondary | ICD-10-CM | POA: Insufficient documentation

## 2018-07-30 DIAGNOSIS — R293 Abnormal posture: Secondary | ICD-10-CM | POA: Insufficient documentation

## 2018-07-30 DIAGNOSIS — M25531 Pain in right wrist: Secondary | ICD-10-CM | POA: Insufficient documentation

## 2018-07-30 NOTE — Patient Instructions (Signed)
Laying on your back, place a towel roll under the middle of your back, stretch arms out to the side,  hold arms over head, stretch arms up towards ceiling hold a shoe box, then lower shoulder blades back to bed, 10 reps,  then raise arms over head within comfortable range 10 reps Laying on your stomach, arms by sides, raise arms, and  squeeze shoulder blades down and back 10 reps,  Prone on elbows lift and lower chest 10 reps Standing cat/ cow and then rocking forwards and backwards for modified child's pose and cobra 10 reps each

## 2018-07-31 ENCOUNTER — Encounter: Payer: Self-pay | Admitting: Physical Therapy

## 2018-07-31 ENCOUNTER — Ambulatory Visit: Payer: 59 | Admitting: Physical Therapy

## 2018-07-31 DIAGNOSIS — R293 Abnormal posture: Secondary | ICD-10-CM | POA: Diagnosis not present

## 2018-07-31 DIAGNOSIS — M6281 Muscle weakness (generalized): Secondary | ICD-10-CM | POA: Diagnosis not present

## 2018-07-31 DIAGNOSIS — M542 Cervicalgia: Secondary | ICD-10-CM

## 2018-07-31 DIAGNOSIS — M79631 Pain in right forearm: Secondary | ICD-10-CM | POA: Diagnosis not present

## 2018-07-31 DIAGNOSIS — M25531 Pain in right wrist: Secondary | ICD-10-CM | POA: Diagnosis not present

## 2018-07-31 NOTE — Therapy (Signed)
[facility] [address] [place], [place], [address] Phone: [phone]   Fax:  [phone]  Occupational Therapy Treatment  Patient Details  Name: Jackie Mcconnell MRN: [id number] Date of Birth: 1971-04-16 Referring Provider (OT): 06/20/18   Encounter Date: 07/30/2018  OT End of Session - 07/31/18 0824    Visit Number  3    Number of Visits  9    Date for OT Re-Evaluation  09/10/18    Authorization Type  [facility]     Authorization Time Period  12 weeks    OT Start Time  1535   2 units only hotpack   OT Stop Time  1622    OT Time Calculation (min)  47 min       Past Medical History:  Diagnosis Date  . Anxiety    due to school studies  . Complication of anesthesia   . GERD (gastroesophageal reflux disease) 08/08/2017  . History of kidney disease   . Hypertension   . PONV (postoperative nausea and vomiting)     Past Surgical History:  Procedure Laterality Date  . ABDOMINAL HYSTERECTOMY     complete 2011  . NEPHRECTOMY Left 04/06/2014   Procedure: NEPHRECTOMY;  Surgeon: Raynelle Bring, MD;  Location: [facility];  Service: Urology;  Laterality: Left;    There were no vitals filed for this visit.  Subjective Assessment - 07/31/18 0824    Subjective   Pt reports forearm and wrist pain for several years    Pertinent History  HTN, kidney disease, left kidney removed, R forearm musculoskeletal myalgias    Patient Stated Goals  decrease pain    Currently in Pain?  No/denies   no pain in forearm/ wrist mild pain in neck/ shoulder-3/10               Treatment: Hot pack x 10 mins to right shoulder followed by soft tissue mobs to right forearm. Gentle wrist flexion/ extension Pt was instructed in HEP for scapular stability and stretching as it is likely related to forarm pain.Pt reports feeling better at end of session.            OT Education - 07/31/18 3267    Education Details  HEP for scapular  stability and gentle stretching as right scapulr weakness is likely releated to forearm pain.    Person(s) Educated  Patient    Methods  Explanation;Demonstration;Verbal cues;Handout    Comprehension  Verbalized understanding;Returned demonstration;Verbal cues required       OT Short Term Goals - 07/31/18 1245      OT SHORT TERM GOAL #1   Title  I with initial HEP.    Status  Achieved      OT SHORT TERM GOAL #2   Title  Pt will verbalize understanding of pain reduction strategies(ice, brace, stretches, positioning)    Status  Achieved        OT Long Term Goals - 07/31/18 8099      OT LONG TERM GOAL #1   Title  I with updated HEP.    Time  8    Period  Weeks    Status  On-going      OT LONG TERM GOAL #2   Title  Pt will report performing work activities with pain consistently less than or equal to 3/10.    Time  8    Period  Weeks    Status  On-going      OT LONG TERM  GOAL #3   Title  Pt will verbalize understanding of AE/adapted strategies to minimize pain.    Time  8    Period  Weeks    Status  On-going            Plan - 07/31/18 1308    Clinical Impression Statement  Pt demonstrates excellent progress towards goals. Pt reports no forearm pain. Pt demonstrates understanding of HEP for scapular stability which is likely related to wrist pain.    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 Pt lives with her wife. PMH: right forearm pain musculoskeletal myalgias, neck pain, HTN, kidney disease with left kidney removed    Occupational performance deficits (Please refer to evaluation for details):  ADL's;IADL's;Work    Rehab Potential  Good    Current Impairments/barriers affecting progress:  Pt reports inital onset several years ago,     OT Frequency  --   8 visits   OT  Duration  8 weeks    OT Treatment/Interventions  Self-care/ADL training;Electrical Stimulation;Iontophoresis;Therapeutic exercise;Patient/family education;Neuromuscular education;Paraffin;Moist Heat;Aquatic Therapy;Fluidtherapy;Energy conservation;Scar mobilization;Therapeutic activities;Passive range of motion;Manual Therapy;DME and/or AE instruction;Contrast Bath;Ultrasound;Cryotherapy    Plan  check goals, [place] continue education anticipate d/c in next 1-2 visits    Consulted and Agree with Plan of Care  Patient       Patient will benefit from skilled therapeutic intervention in order to improve the following deficits and impairments:     Visit Diagnosis: Pain in right forearm  Pain in right wrist  Muscle weakness (generalized)    Problem List Patient Active Problem List   Diagnosis Date Noted  . Benign cystic nephroma 08/08/2017  . GERD (gastroesophageal reflux disease) 08/08/2017  . Labile blood pressure 08/08/2017  . Bloating 08/08/2017  . Physical exam 10/27/2016  . History of renal hypertension 12/20/2015  . Anxiety state 12/20/2015  . Obesity (BMI 35.0-39.9 without comorbidity) ([place]) 12/02/2013    , 07/31/2018, 8:35 AM  [facility] [address] [place], [place], [address] Phone: [phone]   Fax:  [phone]  Name: Jackie Mcconnell MRN: [id number] Date of Birth: 20-Oct-1970

## 2018-07-31 NOTE — Therapy (Signed)
[facility] [address] [place], [place], [address] Phone: [phone]   Fax:  [phone]  Physical Therapy Treatment  Patient Details  Name: Jackie Mcconnell MRN: [id number] Date of Birth: [birth year] Referring Provider (PT): Roseanne Kaufman, MD   Encounter Date: 07/31/2018  PT End of Session - 07/31/18 0806    Visit Number  4    Number of Visits  9    Date for PT Re-Evaluation  08/23/18    Authorization Type  [facility]    PT Start Time  1448    PT Stop Time  0846    PT Time Calculation (min)  40 min    Activity Tolerance  Patient tolerated treatment well    Behavior During Therapy  [facility] for tasks assessed/performed       Past Medical History:  Diagnosis Date  . Anxiety    due to school studies  . Complication of anesthesia   . GERD (gastroesophageal reflux disease) 08/08/2017  . History of kidney disease   . Hypertension   . PONV (postoperative nausea and vomiting)     Past Surgical History:  Procedure Laterality Date  . ABDOMINAL HYSTERECTOMY     complete 2011  . NEPHRECTOMY Left 04/06/2014   Procedure: NEPHRECTOMY;  Surgeon: Raynelle Bring, MD;  Location: [facility];  Service: Urology;  Laterality: Left;    There were no vitals filed for this visit.  Subjective Assessment - 07/31/18 0807    Subjective  I think this therapy is really working. On a bad day at work now it is a 2/10. I've changed the monitor at my desk and the height of my chair. My forearm is almost completely pain free.     Pertinent History  HTN, nephrectomy, obesity    Diagnostic tests  none    Patient Stated Goals  to decrease neck pain    Currently in Pain?  No/denies    Pain Onset  More than a month ago                       [facility] Adult PT Treatment/Exercise - 07/31/18 0001      Neck Exercises: Machines for Strengthening   Other Machines for Strengthening  Sci-Fit for UEs L1 x 4 minutes for warm-up prior to  stretching and manual therapy; pt turning ergonometer reverse direction with cues to focus on scapular adduction and depression       Manual Therapy   Manual Therapy  Soft tissue mobilization;Scapular mobilization;Myofascial release;Manual Traction    Soft tissue mobilization  in supine, MT to bil upper traps, suboccipitals; in left sidelying mobilizing rt scapula with tender areas noted, however no trigger points found    Myofascial Release  bil upper trapezius    Scapular Mobilization  PROM protraction and retraction, depression and elevation with much less tightness noted compared to 07/23/18    Manual Traction  suboccipital traction with slight flexion for relaxation and lengthening of muscles      Self care-Discussed HEP and use of educational information re: office ergonomics and positive changes she has made. Discussed anticipate will be ready to discharge earlier than initially planned if she maintains or improves while on vacation. She agrees with progress made and plan.        PT Education - 07/31/18 1424    Education Details  for relaxing suboccipital muscles, tie 2 tennis balls into a sock and lie down with folded towel supporting some  of the weight of the head    Person(s) Educated  Patient    Methods  Explanation    Comprehension  Verbalized understanding       PT Short Term Goals - 07/31/18 1428      PT SHORT TERM GOAL #1   Title  Patient will be independent with basic HEP (target 4 visits)    Status  Achieved      PT SHORT TERM GOAL #2   Title  Patient will report neck pain <=4/10 during work hours with use of stretches and ergonomic/posture training.     Baseline  07/31/18 reports at worst 2/10 recently while at work    Status  Achieved        PT [place] - 07/12/18 1706      PT [place] #1   Title  Patient will improve cervical ROM in all planes to WNL with no pain (mild stretch, OK)  (Target all LTGs 8th visit)    Status  New      PT LONG TERM  GOAL #2   Title  Patient will report no longer awakening during night due to neck pain.     Status  New      PT LONG TERM GOAL #3   Title  Patient will be independent with updated HEP, including able to verbalize need to continue stretches/changes in position throughout her work day.     Status  New            Plan - 07/31/18 1429    Clinical Impression Statement  Assessed STGs today as pt needs to cancel her next appt and will return after her vacation. She met 2 of 2 STGs and reports she cannot believe how much better she feels. Reports rt forearm pain is 0/10 today and pain is primarily in her neck and around rt scapula now. Patient has been very dedicated in doing her HEP and finding ways to weave the stretches and exercises throughout her day. She has utilized the information on office ergonomics provided and made appropriate changes to her monitor and chair height. Discussed current HEPs from PT and OT and do not feel any exercises need to be updated at this time. Encouraged pt to continue her exercises while on vacation and will reassess on her return if further dry needling is needed.    Rehab Potential  Good    PT Frequency  Other (comment)   8 visits over 8 weeks   PT Duration  8 weeks    PT Treatment/Interventions  ADLs/Self Care Home Management;Electrical Stimulation;Ultrasound;Traction;Moist Heat;Therapeutic exercise;Patient/family education;Manual techniques;Dry needling;Passive range of motion    PT Next Visit Plan  work on chin tuck in standing (?in doorframe); then try standing serratus push with red band while maintaining posture at doorframe; try seated rowing with red band; manual therapy as needed (esp rt Levator)    Consulted and Agree with Plan of Care  Patient       Patient will benefit from skilled therapeutic intervention in order to improve the following deficits and impairments:  Decreased range of motion, Increased fascial restricitons, Increased muscle spasms,  Impaired flexibility, Postural dysfunction, Pain  Visit Diagnosis: Abnormal posture  Cervicalgia     Problem List Patient Active Problem List   Diagnosis Date Noted  . Benign cystic nephroma 08/08/2017  . GERD (gastroesophageal reflux disease) 08/08/2017  . Labile blood pressure 08/08/2017  . Bloating 08/08/2017  . Physical exam 10/27/2016  .  History of renal hypertension 12/20/2015  . Anxiety state 12/20/2015  . Obesity (BMI 35.0-39.9 without comorbidity) ([place]) 12/02/2013    Rexanne Mano, PT 07/31/2018, 2:50 PM  [facility] [address] [place], [place], [address] Phone: [phone]   Fax:  [phone]  Name: Jackie Mcconnell MRN: [id number] Date of Birth: 1971-01-03

## 2018-08-02 ENCOUNTER — Ambulatory Visit: Payer: 59 | Admitting: Physical Therapy

## 2018-08-06 ENCOUNTER — Encounter: Payer: 59 | Admitting: Occupational Therapy

## 2018-08-12 ENCOUNTER — Ambulatory Visit: Payer: 59 | Admitting: Physical Therapy

## 2018-08-12 ENCOUNTER — Encounter: Payer: Self-pay | Admitting: Physical Therapy

## 2018-08-12 DIAGNOSIS — R293 Abnormal posture: Secondary | ICD-10-CM | POA: Diagnosis not present

## 2018-08-12 DIAGNOSIS — M542 Cervicalgia: Secondary | ICD-10-CM | POA: Diagnosis not present

## 2018-08-12 DIAGNOSIS — M79631 Pain in right forearm: Secondary | ICD-10-CM | POA: Diagnosis not present

## 2018-08-12 DIAGNOSIS — M6281 Muscle weakness (generalized): Secondary | ICD-10-CM | POA: Diagnosis not present

## 2018-08-12 DIAGNOSIS — M25531 Pain in right wrist: Secondary | ICD-10-CM | POA: Diagnosis not present

## 2018-08-12 NOTE — Therapy (Signed)
[facility] [address] [place], [place], [address] Phone: [phone]   Fax:  [phone]  Physical Therapy Treatment  Patient Details  Name: Jackie Mcconnell MRN: [id number] Date of Birth: [birth year] Referring Provider (PT): Roseanne Kaufman, MD   Encounter Date: 08/12/2018  PT End of Session - 08/12/18 1946    Visit Number  5    Number of Visits  9    Date for PT Re-Evaluation  08/23/18    Authorization Type  [facility]    PT Start Time  1537    PT Stop Time  1627    PT Time Calculation (min)  50 min    Activity Tolerance  Patient tolerated treatment well    Behavior During Therapy  [facility] for tasks assessed/performed       Past Medical History:  Diagnosis Date  . Anxiety    due to school studies  . Complication of anesthesia   . GERD (gastroesophageal reflux disease) 08/08/2017  . History of kidney disease   . Hypertension   . PONV (postoperative nausea and vomiting)     Past Surgical History:  Procedure Laterality Date  . ABDOMINAL HYSTERECTOMY     complete 2011  . NEPHRECTOMY Left 04/06/2014   Procedure: NEPHRECTOMY;  Surgeon: Raynelle Bring, MD;  Location: [facility];  Service: Urology;  Laterality: Left;    There were no vitals filed for this visit.  Subjective Assessment - 08/12/18 1536    Subjective  Reports she has had no pain in rt forearm and has cancelled her remaining OT appts. Did do her stretches and exercises while on vacation.     Pertinent History  HTN, nephrectomy, obesity    Diagnostic tests  none    Patient Stated Goals  to decrease neck pain    Currently in Pain?  Yes    Pain Score  2     Pain Location  Neck    Pain Orientation  Right    Pain Descriptors / Indicators  Aching    Pain Type  Chronic pain    Pain Radiating Towards  down between shoulder blades    Pain Onset  More than a month ago    Pain Frequency  Intermittent    Aggravating Factors   work tasks    Pain Relieving  Factors  rest stretches                       [facility] Adult PT Treatment/Exercise - 08/12/18 1700      Neck Exercises: Supine   Neck Retraction  5 reps;5 secs    Cervical Rotation  Both;5 reps    Lateral Flexion  Both;5 reps      Shoulder Exercises: Supine   Protraction  Strengthening;Both;10 reps;Weights    Protraction Weight (lbs)  6      Shoulder Exercises: Standing   Protraction  Strengthening;Both;10 reps    Protraction Limitations  at wall, at counter      Shoulder Exercises: Stretch   Other Shoulder Stretches  modified child's pose at counter    Other Shoulder Stretches  back to counter with hands on counter and anterior chest stretch      Manual Therapy   Manual Therapy  Soft tissue mobilization;Scapular mobilization;Myofascial release;Manual Traction    Soft tissue mobilization  in supine, MT to bil upper traps, suboccipitals; in left sidelying mobilizing rt scapula with tender areas noted, however no trigger points found  Myofascial Release  bil upper trapezius    Scapular Mobilization  PROM protraction and retraction, depression and elevation with much less tightness noted    Manual Traction  suboccipital traction with slight flexion for relaxation and lengthening of muscles             PT Education - 08/12/18 1945    Education Details  addition to HEP    Person(s) Educated  Patient    Methods  Explanation;Demonstration;Verbal cues;Handout    Comprehension  Verbalized understanding;Returned demonstration;Verbal cues required       PT Short Term Goals - 07/31/18 1428      PT SHORT TERM GOAL #1   Title  Patient will be independent with basic HEP (target 4 visits)    Status  Achieved      PT SHORT TERM GOAL #2   Title  Patient will report neck pain <=4/10 during work hours with use of stretches and ergonomic/posture training.     Baseline  07/31/18 reports at worst 2/10 recently while at work    Status  Achieved        PT [place] - 08/12/18 1947      PT [place] #1   Title  Patient will improve cervical ROM in all planes to WNL with no pain (mild stretch, OK)  (Target all LTGs 8th visit)    Status  New      PT LONG TERM GOAL #2   Title  Patient will report no longer awakening during night due to neck pain.     Baseline  08/12/18  Patient reports neck no longer awakens her     Status  Achieved      PT LONG TERM GOAL #3   Title  Patient will be independent with updated HEP, including able to verbalize need to continue stretches/changes in position throughout her work day.     Status  New            Plan - 08/12/18 1948    Clinical Impression Statement  Patient maintained good progress while on vacation, although felt slightly worse today after return to work. Session focused on manual therapy to reduce trigger points, stretch cervical muscles, and strengthening neck and scapular muscles. Began to assess LTGs as pt has done very well. Patient met LTG#2 and discussed possible discharge after next visit. Patient agrees with plan.     Rehab Potential  Good    PT Frequency  Other (comment)   8 visits over 8 weeks   PT Duration  8 weeks    PT Treatment/Interventions  ADLs/Self Care Home Management;Electrical Stimulation;Ultrasound;Traction;Moist Heat;Therapeutic exercise;Patient/family education;Manual techniques;Dry needling;Passive range of motion    PT Next Visit Plan  assess need for dry needling (pt unsure she still needs it although has painful area rt rhomboid); assess remaining 2 LTGs (cervical ROM and independence with HEP) and probable discharge (pt in agreement)    Consulted and Agree with Plan of Care  Patient       Patient will benefit from skilled therapeutic intervention in order to improve the following deficits and impairments:  Decreased range of motion, Increased fascial restricitons, Increased muscle spasms, Impaired flexibility, Postural dysfunction, Pain  Visit  Diagnosis: Cervicalgia     Problem List Patient Active Problem List   Diagnosis Date Noted  . Benign cystic nephroma 08/08/2017  . GERD (gastroesophageal reflux disease) 08/08/2017  . Labile blood pressure 08/08/2017  . Bloating 08/08/2017  . Physical exam 10/27/2016  .  History of renal hypertension 12/20/2015  . Anxiety state 12/20/2015  . Obesity (BMI 35.0-39.9 without comorbidity) ([place]) 12/02/2013    Rexanne Mano, PT 08/12/2018, 7:53 PM  [facility] [address] [place], [place], [address] Phone: [phone]   Fax:  [phone]  Name: Jackie Mcconnell MRN: [id number] Date of Birth: 08-18-1970

## 2018-08-12 NOTE — Patient Instructions (Signed)
Access Code: [email or web address]  URL: [email or web address]  Date: 08/12/2018  Prepared by: Barry Brunner   Exercises  Supine Cervical Rotation PROM - 3 reps - 1 sets - 30 seconds hold - 2-3x daily - 7x weekly  Supine Chin Tuck - 3 reps - 1 sets - 30 hold - 2-3x daily - 7x weekly  Seated Shoulder Rolls - 3-5 reps - 1 sets - - hold - 1-3x daily - 7x weekly  Seated Upper Trap Stretch - 2-3 reps - 1 sets - 30 seconds hold - 2-3x daily - 7x weekly  serratus punch - 10 reps - 1 sets - 5 counts hold - 1x daily - 7x weekly  Plyometric Serratus Pushups On BOSU Flat Side Up - 10 reps - 1 sets - - hold - 1x daily - 5x weekly  Full Plank with Scapular Protraction Retraction AROM - 10 reps - 1 sets - 3-5 seconds hold - 1x daily - 5x weekly  Patient Education  Office Posture  Sleep Positions

## 2018-08-13 ENCOUNTER — Ambulatory Visit: Payer: 59 | Admitting: Occupational Therapy

## 2018-08-15 ENCOUNTER — Ambulatory Visit: Payer: 59 | Admitting: Physical Therapy

## 2018-08-15 ENCOUNTER — Encounter: Payer: Self-pay | Admitting: Physical Therapy

## 2018-08-15 DIAGNOSIS — R293 Abnormal posture: Secondary | ICD-10-CM

## 2018-08-15 DIAGNOSIS — M25531 Pain in right wrist: Secondary | ICD-10-CM | POA: Diagnosis not present

## 2018-08-15 DIAGNOSIS — M542 Cervicalgia: Secondary | ICD-10-CM

## 2018-08-15 DIAGNOSIS — H15101 Unspecified episcleritis, right eye: Secondary | ICD-10-CM | POA: Diagnosis not present

## 2018-08-15 DIAGNOSIS — M6281 Muscle weakness (generalized): Secondary | ICD-10-CM | POA: Diagnosis not present

## 2018-08-15 DIAGNOSIS — M79631 Pain in right forearm: Secondary | ICD-10-CM | POA: Diagnosis not present

## 2018-08-15 NOTE — Therapy (Signed)
[facility] [address] [place], [place], [address] Phone: [phone]   Fax:  [phone]  Physical Therapy Treatment and D/C Summary  Patient Details  Name: Jackie Mcconnell MRN: [id number] Date of Birth: 06-Jun-1971 Referring Provider (PT): Roseanne Kaufman, MD   Encounter Date: 08/15/2018  PT End of Session - 08/15/18 1224    Visit Number  6    Number of Visits  9    Date for PT Re-Evaluation  08/23/18    Authorization Type  [facility]    PT Start Time  0803    PT Stop Time  0845    PT Time Calculation (min)  42 min    Activity Tolerance  Patient tolerated treatment well    Behavior During Therapy  [facility] for tasks assessed/performed       Past Medical History:  Diagnosis Date  . Anxiety    due to school studies  . Complication of anesthesia   . GERD (gastroesophageal reflux disease) 08/08/2017  . History of kidney disease   . Hypertension   . PONV (postoperative nausea and vomiting)     Past Surgical History:  Procedure Laterality Date  . ABDOMINAL HYSTERECTOMY     complete 2011  . NEPHRECTOMY Left 04/06/2014   Procedure: NEPHRECTOMY;  Surgeon: Raynelle Bring, MD;  Location: [facility];  Service: Urology;  Laterality: Left;    There were no vitals filed for this visit.  Subjective Assessment - 08/15/18 0806    Subjective  Pt has noticed a big improvement in neck ROM when driving.  Wrist pain has completely gone away.  Has gained more awareness of posture and how stress can affect neck tightness.    Pertinent History  HTN, nephrectomy, obesity    Diagnostic tests  none    Patient Stated Goals  to decrease neck pain    Currently in Pain?  Yes    Pain Score  1     Pain Location  Scapula    Pain Orientation  Right    Pain Descriptors / Indicators  Tightness    Pain Type  Chronic pain    Pain Onset  More than a month ago         [facility] PT Assessment - 08/15/18 0832      ROM / Strength   AROM / PROM  / Strength  AROM      AROM   Cervical Flexion  70    Cervical Extension  40    Cervical - Right Side Bend  50    Cervical - Left Side Bend  50    Cervical - Right Rotation  70    Cervical - Left Rotation  65                   [facility] Adult PT Treatment/Exercise - 08/15/18 0828      Shoulder Exercises: Stretch   Cross Chest Stretch  2 reps;60 seconds    Cross Chest Stretch Limitations  supine with tennis ball on R side between scapula and spine for trigger point release      Manual Therapy   Manual Therapy  Other (comment)    Other Manual Therapy  Use of tennis ball for trigger point release to R rhomboid in supine       Trigger Point Dry Needling - 08/15/18 0809    Consent Given?  Yes    Muscles Treated Upper Body  Rhomboids   Right side, 2  needles 30 x 40    Rhomboids Response  Twitch response elicited       Access Code: [email or web address]  URL: [email or web address]  Date: 08/15/2018  Prepared by: Misty Stanley   Exercises  Doorway Rhomboid Stretch - 30 seconds hold - 2x daily - 7x weekly  Supine Upper-Thoracic Mobilization - 60 seconds hold - 1x daily - 7x weekly         PT Education - 08/15/18 1224    Education Details  final HEP, D/C today    Person(s) Educated  Patient    Methods  Explanation;Demonstration;Handout    Comprehension  Verbalized understanding;Returned demonstration       PT Short Term Goals - 07/31/18 1428      PT SHORT TERM GOAL #1   Title  Patient will be independent with basic HEP (target 4 visits)    Status  Achieved      PT SHORT TERM GOAL #2   Title  Patient will report neck pain <=4/10 during work hours with use of stretches and ergonomic/posture training.     Baseline  07/31/18 reports at worst 2/10 recently while at work    Status  Achieved        PT [place] - 08/15/18 1226      PT [place] #1   Title  Patient will improve cervical ROM in all planes to WNL with no pain (mild stretch, OK)   (Target all LTGs 8th visit)    Status  Achieved      PT LONG TERM GOAL #2   Title  Patient will report no longer awakening during night due to neck pain.     Baseline  08/12/18  Patient reports neck no longer awakens her     Status  Achieved      PT LONG TERM GOAL #3   Title  Patient will be independent with updated HEP, including able to verbalize need to continue stretches/changes in position throughout her work day.     Status  Achieved            Plan - 08/15/18 1226    Clinical Impression Statement  Pt has made excellent progress and has met all LTG.  Performed trigger point dry needling to R rhomboids with palpable twitch observed and improvement in pain and increase in ROM noted.  Pt demonstrates overall improvement in cervical ROM, decreased pain and demonstrates independence with HEP and monitoring posture during the day.  Pt agreeable to D/C today and is pleased with results of therapy and pain relief in wrist    Rehab Potential  Good    PT Frequency  Other (comment)   8 visits over 8 weeks   PT Duration  8 weeks    PT Treatment/Interventions  ADLs/Self Care Home Management;Electrical Stimulation;Ultrasound;Traction;Moist Heat;Therapeutic exercise;Patient/family education;Manual techniques;Dry needling;Passive range of motion    Consulted and Agree with Plan of Care  Patient       Patient will benefit from skilled therapeutic intervention in order to improve the following deficits and impairments:  Decreased range of motion, Increased fascial restricitons, Increased muscle spasms, Impaired flexibility, Postural dysfunction, Pain  Visit Diagnosis: Cervicalgia  Abnormal posture  Muscle weakness (generalized)     Problem List Patient Active Problem List   Diagnosis Date Noted  . Benign cystic nephroma 08/08/2017  . GERD (gastroesophageal reflux disease) 08/08/2017  . Labile blood pressure 08/08/2017  . Bloating 08/08/2017  . Physical exam 10/27/2016  . History  of renal  hypertension 12/20/2015  . Anxiety state 12/20/2015  . Obesity (BMI 35.0-39.9 without comorbidity) ([place]) 12/02/2013    PHYSICAL THERAPY DISCHARGE SUMMARY  Visits from Start of Care: 6  Current functional level related to goals / functional outcomes: See LTG achievement and impression statement above   Remaining deficits: Mild discomfort and decreased ROM in cervical spine   Education / Equipment: HEP  Plan: Patient agrees to discharge.  Patient goals were met. Patient is being discharged due to meeting the stated rehab goals.  ?????      Rico Junker, PT, DPT 08/15/18    12:34 PM    [facility] [address] [place], [place], [address] Phone: [phone]   Fax:  [phone]  Name: Jackie Mcconnell MRN: [id number] Date of Birth: February 22, 1971

## 2018-08-15 NOTE — Patient Instructions (Signed)
Access Code: [email or web address]  URL: [email or web address]  Date: 08/15/2018  Prepared by: Misty Stanley   Exercises  Doorway Rhomboid Stretch - 30 seconds hold - 2x daily - 7x weekly  Supine Upper-Thoracic Mobilization - 60 seconds hold - 1x daily - 7x weekly

## 2018-08-20 ENCOUNTER — Ambulatory Visit: Payer: 59 | Admitting: Physical Therapy

## 2018-08-26 ENCOUNTER — Ambulatory Visit: Payer: 59 | Admitting: Physical Therapy

## 2018-08-26 MED FILL — ESTRADIOL 0.5 MG TABS: 0.5 | 90 days supply | Qty: 90 | Fill #1

## 2018-08-26 MED FILL — ALPRAZolam 0.5 MG TABS: 0.5 | 15 days supply | Qty: 30 | Fill #2

## 2018-08-26 MED FILL — CYCLOBENZAPRINE HCL 5 MG TA: 5 | 10 days supply | Qty: 30 | Fill #1

## 2018-09-09 DIAGNOSIS — H15101 Unspecified episcleritis, right eye: Secondary | ICD-10-CM | POA: Diagnosis not present

## 2018-09-09 MED FILL — PREDNISOLONE AC 1% EYE DROP: 1 | 14 days supply | Qty: 5 | Fill #0

## 2018-09-23 ENCOUNTER — Other Ambulatory Visit: Payer: Self-pay | Admitting: Family Medicine

## 2018-09-23 MED FILL — ALPRAZolam 0.5 MG TABS: 0.5 | 15 days supply | Qty: 30 | Fill #0

## 2018-09-23 MED FILL — PANTOPRAZOLE SOD DR 40 MG T: 40 | 90 days supply | Qty: 90 | Fill #1

## 2018-09-23 MED FILL — METOPROLOL SUCCINATE ER 50: 50 | 30 days supply | Qty: 30 | Fill #1

## 2018-09-23 NOTE — Telephone Encounter (Signed)
Last refill:03/07/18 #90, 1 Last OV:12/10/17

## 2018-09-24 ENCOUNTER — Other Ambulatory Visit: Payer: Self-pay | Admitting: Family Medicine

## 2018-09-24 MED FILL — CYCLOBENZAPRINE HCL 5 MG TA: 5 | 10 days supply | Qty: 30 | Fill #0

## 2018-11-20 MED FILL — METOPROLOL SUCCINATE ER 50: 50 | 30 days supply | Qty: 30 | Fill #2

## 2018-11-29 ENCOUNTER — Other Ambulatory Visit: Payer: Self-pay | Admitting: Family Medicine

## 2018-11-29 MED FILL — ESTRADIOL 0.5 MG TABS: 0.5 | 90 days supply | Qty: 90 | Fill #0

## 2018-11-29 NOTE — Telephone Encounter (Signed)
LMOVM asking patient to schedule CPE

## 2018-11-29 NOTE — Telephone Encounter (Signed)
Per note, Katie called and LMOVM to schedule CPE. Ok to send in any refills?

## 2018-12-07 ENCOUNTER — Inpatient Hospital Stay
Admission: RE | Admit: 2018-12-07 | Discharge: 2018-12-07 | Disposition: A | Discharge status: Home / Self Care | Payer: 59 | Source: Ambulatory Visit

## 2018-12-07 ENCOUNTER — Telehealth: Payer: 59 | Admitting: Physician Assistant

## 2018-12-07 DIAGNOSIS — B349 Viral infection, unspecified: Secondary | ICD-10-CM

## 2018-12-07 MED ORDER — ONDANSETRON HCL 4 MG PO TABS: 4.0000 mg | ORAL_TABLET | Freq: Three times a day (TID) | ORAL | 0 refills | Status: DC | PRN

## 2018-12-07 MED FILL — ONDANSETRON HCL 4 MG TABLET: 4 | 6 days supply | Qty: 20 | Fill #0

## 2018-12-07 NOTE — Progress Notes (Signed)
I have spent 5 minutes in review of e-visit questionnaire, review and updating patient chart, medical decision making and response to patient.   Zyanna Leisinger Cody [place]race [place], PA-C    

## 2018-12-07 NOTE — Progress Notes (Signed)
E-Visit for [employer] Virus Screening   Your current symptoms could be consistent with the coronavirus or just a run of the mill viral infection. It is hard to say as symptoms just started last night. I would recommend you contact [facility] so they can set you up for testing to further assess.  Please quarantine yourself while awaiting your test results.  You have been enrolled in [facility] for COVID-19.  Daily you will receive a questionnaire within the [place] website. Our COVID-19 response team will be monitoring your responses daily.  I am sending in a prescription for zofran for nausea/vomiting.   COVID-19 is a respiratory illness with symptoms that are similar to the flu. Symptoms are typically mild to moderate, but there have been cases of severe illness and death due to the virus. The following symptoms may appear 2-14 days after exposure: . Fever . Cough . Shortness of breath or difficulty breathing . Chills . Repeated shaking with chills . Muscle pain . Headache . Sore throat . New loss of taste or smell . Fatigue . Congestion or runny nose . Nausea or vomiting . Diarrhea  It is vitally important that if you feel that you have an infection such as this virus or any other virus that you stay home and away from places where you may spread it to others.  You should self-quarantine for 14 days if you have symptoms that could potentially be coronavirus or have been in close contact a with a person diagnosed with COVID-19 within the last 2 weeks. You should avoid contact with people age 50 and older.   You should wear a mask or cloth face covering over your nose and mouth if you must be around other people or animals, including pets (even at home). Try to stay at least 6 feet away from other people. This will protect the people around you.   You may also take acetaminophen (Tylenol) as needed for fever.   Reduce your risk of any infection by using the same  precautions used for avoiding the common cold or flu:  Marland Kitchen Wash your hands often with soap and warm water for at least 20 seconds.  If soap and water are not readily available, use an alcohol-based hand sanitizer with at least 60% alcohol.  . If coughing or sneezing, cover your mouth and nose by coughing or sneezing into the elbow areas of your shirt or coat, into a tissue or into your sleeve (not your hands). . Avoid shaking hands with others and consider head nods or verbal greetings only. . Avoid touching your eyes, nose, or mouth with unwashed hands.  . Avoid close contact with people who are sick. . Avoid places or events with large numbers of people in one location, like concerts or sporting events. . Carefully consider travel plans you have or are making. . If you are planning any travel outside or inside the [place], visit the CDC's Travelers' Health webpage for the latest health notices. . If you have some symptoms but not all symptoms, continue to monitor at home and seek medical attention if your symptoms worsen. . If you are having a medical emergency, call 911.  HOME CARE . Only take medications as instructed by your medical team. . Drink plenty of fluids and get plenty of rest. . A steam or ultrasonic humidifier can help if you have congestion.   GET HELP RIGHT AWAY IF YOU HAVE EMERGENCY WARNING SIGNS** FOR COVID-19. If you or  someone is showing any of these signs seek emergency medical care immediately. Call 911 or proceed to your closest emergency facility if: . You develop worsening high fever. . Trouble breathing . Bluish lips or face . Persistent pain or pressure in the chest . New confusion . Inability to wake or stay awake . You cough up blood. . Your symptoms become more severe  **This list is not all possible symptoms. Contact your medical provider for any symptoms that are sever or concerning to you.   MAKE SURE YOU   Understand these instructions.  Will watch your  condition.  Will get help right away if you are not doing well or get worse.  Your e-visit answers were reviewed by a board certified advanced clinical practitioner to complete your personal care plan.  Depending on the condition, your plan could have included both over the counter or prescription medications.  If there is a problem please reply once you have received a response from your provider.  Your safety is important to [place].  If you have drug allergies check your prescription carefully.    You can use MyChart to ask questions about today's visit, request a non-urgent call back, or ask for a work or school excuse for 24 hours related to this e-Visit. If it has been greater than 24 hours you will need to follow up with your provider, or enter a new e-Visit to address those concerns. You will get an e-mail in the next two days asking about your experience.  I hope that your e-visit has been valuable and will speed your recovery. Thank you for using e-visits.

## 2018-12-18 ENCOUNTER — Other Ambulatory Visit: Payer: Self-pay | Admitting: Family Medicine

## 2018-12-18 DIAGNOSIS — Z1231 Encounter for screening mammogram for malignant neoplasm of breast: Secondary | ICD-10-CM

## 2018-12-19 ENCOUNTER — Encounter: Payer: 59 | Admitting: Physician Assistant

## 2018-12-24 MED FILL — PANTOPRAZOLE SOD DR 40 MG T: 40 | 30 days supply | Qty: 30 | Fill #2

## 2019-01-10 ENCOUNTER — Other Ambulatory Visit: Payer: Self-pay | Admitting: Family Medicine

## 2019-01-10 MED FILL — METOPROLOL SUCCINATE ER 50: 50 | 30 days supply | Qty: 30 | Fill #0

## 2019-01-14 ENCOUNTER — Encounter: Payer: Self-pay | Admitting: Physician Assistant

## 2019-01-14 ENCOUNTER — Other Ambulatory Visit: Payer: Self-pay

## 2019-01-14 ENCOUNTER — Ambulatory Visit (INDEPENDENT_AMBULATORY_CARE_PROVIDER_SITE_OTHER): Payer: 59 | Admitting: Physician Assistant

## 2019-01-14 VITALS — BP 118/80 | HR 77 | Temp 98.0°F | Resp 16 | Ht 66.0 in | Wt 213.0 lb

## 2019-01-14 DIAGNOSIS — K219 Gastro-esophageal reflux disease without esophagitis: Secondary | ICD-10-CM | POA: Diagnosis not present

## 2019-01-14 DIAGNOSIS — R0989 Other specified symptoms and signs involving the circulatory and respiratory systems: Secondary | ICD-10-CM

## 2019-01-14 DIAGNOSIS — Z0000 Encounter for general adult medical examination without abnormal findings: Secondary | ICD-10-CM | POA: Diagnosis not present

## 2019-01-14 DIAGNOSIS — F411 Generalized anxiety disorder: Secondary | ICD-10-CM

## 2019-01-14 DIAGNOSIS — Z789 Other specified health status: Secondary | ICD-10-CM

## 2019-01-14 DIAGNOSIS — M542 Cervicalgia: Secondary | ICD-10-CM | POA: Diagnosis not present

## 2019-01-14 DIAGNOSIS — E669 Obesity, unspecified: Secondary | ICD-10-CM | POA: Diagnosis not present

## 2019-01-14 LAB — URINALYSIS, ROUTINE W REFLEX MICROSCOPIC
Bilirubin Urine: NEGATIVE
Hgb urine dipstick: NEGATIVE
Ketones, ur: NEGATIVE
Leukocytes,Ua: NEGATIVE
Nitrite: NEGATIVE
RBC / HPF: NONE SEEN (ref 0–?)
Specific Gravity, Urine: 1.02 (ref 1.000–1.030)
Total Protein, Urine: NEGATIVE
Urine Glucose: NEGATIVE
Urobilinogen, UA: 0.2 (ref 0.0–1.0)
pH: 8 (ref 5.0–8.0)

## 2019-01-14 LAB — COMPREHENSIVE METABOLIC PANEL
ALT: 16 U/L (ref 0–35)
AST: 16 U/L (ref 0–37)
Albumin: 4.9 g/dL (ref 3.5–5.2)
Alkaline Phosphatase: 60 U/L (ref 39–117)
BUN: 10 mg/dL (ref 6–23)
CO2: 28 mEq/L (ref 19–32)
Calcium: 9.7 mg/dL (ref 8.4–10.5)
Chloride: 105 mEq/L (ref 96–112)
Creatinine, Ser: 0.8 mg/dL (ref 0.40–1.20)
GFR: 76.82 mL/min (ref >60.00)
Glucose, Bld: 86 mg/dL (ref 70–99)
Potassium: 3.9 mEq/L (ref 3.5–5.1)
Sodium: 141 mEq/L (ref 135–145)
Total Bilirubin: 0.6 mg/dL (ref 0.2–1.2)
Total Protein: 7.2 g/dL (ref 6.0–8.3)

## 2019-01-14 LAB — CBC WITH DIFFERENTIAL/PLATELET
Basophils Absolute: 0 10*3/uL (ref 0.0–0.1)
Basophils Relative: 0.5 % (ref 0.0–3.0)
Eosinophils Absolute: 0.2 10*3/uL (ref 0.0–0.7)
Eosinophils Relative: 2.9 % (ref 0.0–5.0)
HCT: 45.5 % (ref 36.0–46.0)
Hemoglobin: 15.5 g/dL — ABNORMAL HIGH (ref 12.0–15.0)
Lymphocytes Relative: 27.1 % (ref 12.0–46.0)
Lymphs Abs: 1.5 10*3/uL (ref 0.7–4.0)
MCHC: 34 g/dL (ref 30.0–36.0)
MCV: 94.4 fl (ref 78.0–100.0)
Monocytes Absolute: 0.4 10*3/uL (ref 0.1–1.0)
Monocytes Relative: 7.9 % (ref 3.0–12.0)
Neutro Abs: 3.4 10*3/uL (ref 1.4–7.7)
Neutrophils Relative %: 61.6 % (ref 43.0–77.0)
Platelets: 236 10*3/uL (ref 150.0–400.0)
RBC: 4.82 Mil/uL (ref 3.87–5.11)
RDW: 13.1 % (ref 11.5–15.5)
WBC: 5.5 10*3/uL (ref 4.0–10.5)

## 2019-01-14 LAB — LIPID PANEL
Cholesterol: 201 mg/dL — ABNORMAL HIGH (ref 0–200)
HDL: 53 mg/dL (ref >39.00)
LDL Cholesterol: 123 mg/dL — ABNORMAL HIGH (ref 0–99)
NonHDL: 147.59
Total CHOL/HDL Ratio: 4
Triglycerides: 125 mg/dL (ref 0.0–149.0)
VLDL: 25 mg/dL (ref 0.0–40.0)

## 2019-01-14 LAB — VITAMIN B12: Vitamin B-12: 451 pg/mL (ref 211–911)

## 2019-01-14 LAB — HEMOGLOBIN A1C: Hgb A1c MFr Bld: 5.1 % (ref 4.6–6.5)

## 2019-01-14 LAB — TSH: TSH: 2.04 u[IU]/mL (ref 0.35–4.50)

## 2019-01-14 MED ORDER — METOPROLOL SUCCINATE ER 50 MG PO TB24: 50.0000 mg | ORAL_TABLET | Freq: Every day | ORAL | 0 refills | Status: DC

## 2019-01-14 MED ORDER — ALPRAZOLAM 0.5 MG PO TABS: ORAL_TABLET | ORAL | 0 refills | Status: DC

## 2019-01-14 MED ORDER — TRAMADOL HCL 50 MG PO TABS: 50.0000 mg | ORAL_TABLET | Freq: Two times a day (BID) | ORAL | 0 refills | Status: DC | PRN

## 2019-01-14 MED ORDER — PANTOPRAZOLE SODIUM 40 MG PO TBEC: 40.0000 mg | DELAYED_RELEASE_TABLET | Freq: Every day | ORAL | 0 refills | Status: DC

## 2019-01-14 MED FILL — traMADol HCL 50 MG TABS: 50 | 15 days supply | Qty: 30 | Fill #0

## 2019-01-14 MED FILL — ALPRAZolam 0.5 MG TABS: 0.5 | 15 days supply | Qty: 30 | Fill #0

## 2019-01-14 NOTE — Patient Instructions (Signed)
Please go to the lab for blood work.   Our office will call you with your results unless you have chosen to receive results via MyChart.  If your blood work is normal we will follow-up each year for physicals and as scheduled for chronic medical problems.  If anything is abnormal we will treat accordingly and get you in for a follow-up.  Work on decreasing the Pantoprazole to every other day for 1-2 weeks.  Then you can completely stop the medication if no breakthrough symptoms.   Keep up with diet and exercise. You should be so proud of all the hard work you have done. Keep it up! Try to work in the intermittent jogging with walking.  Build up to more consistent jogging.    Preventive Care 15-44 Years Old, Female Preventive care refers to visits with your health care provider and lifestyle choices that can promote health and wellness. This includes:  A yearly physical exam. This may also be called an annual well check.  Regular dental visits and eye exams.  Immunizations.  Screening for certain conditions.  Healthy lifestyle choices, such as eating a healthy diet, getting regular exercise, not using drugs or products that contain nicotine and tobacco, and limiting alcohol use. What can I expect for my preventive care visit? Physical exam Your health care provider will check your:  Height and weight. This may be used to calculate body mass index (BMI), which tells if you are at a healthy weight.  Heart rate and blood pressure.  Skin for abnormal spots. Counseling Your health care provider may ask you questions about your:  Alcohol, tobacco, and drug use.  Emotional well-being.  Home and relationship well-being.  Sexual activity.  Eating habits.  Work and work Statistician.  Method of birth control.  Menstrual cycle.  Pregnancy history. What immunizations do I need?  Influenza (flu) vaccine  This is recommended every year. Tetanus, diphtheria, and  pertussis (Tdap) vaccine  You may need a Td booster every 10 years. Varicella (chickenpox) vaccine  You may need this if you have not been vaccinated. Zoster (shingles) vaccine  You may need this after age 1. Measles, mumps, and rubella (MMR) vaccine  You may need at least one dose of MMR if you were born in 1957 or later. You may also need a second dose. Pneumococcal conjugate (PCV13) vaccine  You may need this if you have certain conditions and were not previously vaccinated. Pneumococcal polysaccharide (PPSV23) vaccine  You may need one or two doses if you smoke cigarettes or if you have certain conditions. Meningococcal conjugate (MenACWY) vaccine  You may need this if you have certain conditions. Hepatitis A vaccine  You may need this if you have certain conditions or if you travel or work in places where you may be exposed to hepatitis A. Hepatitis B vaccine  You may need this if you have certain conditions or if you travel or work in places where you may be exposed to hepatitis B. Haemophilus influenzae type b (Hib) vaccine  You may need this if you have certain conditions. Human papillomavirus (HPV) vaccine  If recommended by your health care provider, you may need three doses over 6 months. You may receive vaccines as individual doses or as more than one vaccine together in one shot (combination vaccines). Talk with your health care provider about the risks and benefits of combination vaccines. What tests do I need? Blood tests  Lipid and cholesterol levels. These may be checked every  5 years, or more frequently if you are over 46 years old.  Hepatitis C test.  Hepatitis B test. Screening  Lung cancer screening. You may have this screening every year starting at age 51 if you have a 30-pack-year history of smoking and currently smoke or have quit within the past 15 years.  Colorectal cancer screening. All adults should have this screening starting at age 50 and  continuing until age 92. Your health care provider may recommend screening at age 64 if you are at increased risk. You will have tests every 1-10 years, depending on your results and the type of screening test.  Diabetes screening. This is done by checking your blood sugar (glucose) after you have not eaten for a while (fasting). You may have this done every 1-3 years.  Mammogram. This may be done every 1-2 years. Talk with your health care provider about when you should start having regular mammograms. This may depend on whether you have a family history of breast cancer.  BRCA-related cancer screening. This may be done if you have a family history of breast, ovarian, tubal, or peritoneal cancers.  Pelvic exam and Pap test. This may be done every 3 years starting at age 67. Starting at age 33, this may be done every 5 years if you have a Pap test in combination with an HPV test. Other tests  Sexually transmitted disease (STD) testing.  Bone density scan. This is done to screen for osteoporosis. You may have this scan if you are at high risk for osteoporosis. Follow these instructions at home: Eating and drinking  Eat a diet that includes fresh fruits and vegetables, whole grains, lean protein, and low-fat dairy.  Take vitamin and mineral supplements as recommended by your health care provider.  Do not drink alcohol if: ? Your health care provider tells you not to drink. ? You are pregnant, may be pregnant, or are planning to become pregnant.  If you drink alcohol: ? Limit how much you have to 0-1 drink a day. ? Be aware of how much alcohol is in your drink. In the [place] one drink equals one 12 oz bottle of beer (355 mL), one 5 oz glass of wine (148 mL), or one 1 oz glass of hard liquor (44 mL). Lifestyle  Take daily care of your teeth and gums.  Stay active. Exercise for at least 30 minutes on 5 or more days each week.  Do not use any products that contain nicotine or tobacco,  such as cigarettes, e-cigarettes, and chewing tobacco. If you need help quitting, ask your health care provider.  If you are sexually active, practice safe sex. Use a condom or other form of birth control (contraception) in order to prevent pregnancy and STIs (sexually transmitted infections).  If told by your health care provider, take low-dose aspirin daily starting at age 79. What's next?  Visit your health care provider once a year for a well check visit.  Ask your health care provider how often you should have your eyes and teeth checked.  Stay up to date on all vaccines. This information is not intended to replace advice given to you by your health care provider. Make sure you discuss any questions you have with your health care provider. Document Released: 07/09/2015 Document Revised: 02/21/2018 Document Reviewed: 02/21/2018 Elsevier Patient Education  2020 [employer].

## 2019-01-14 NOTE — Progress Notes (Signed)
Patient presents to clinic today for annual exam.  Patient is fasting for labs. Has lost 20+ pounds in the past year through [employer]. For exercise, is walking daily and stationary bike. Also likes to work in the garden.   Acute Concerns: Patient is requesting a UA in addition to regular labs today. Is asymptomatic but wanting to have due to history of nephrectomy.  Health Maintenance: Immunizations -- UTD Mammogram -- UTD. Scheduled for repeat 04/04/2019 HIV Screen -- 15 years ago.   Past Medical History:  Diagnosis Date  . Anxiety    due to school studies  . Complication of anesthesia   . GERD (gastroesophageal reflux disease) 08/08/2017  . History of kidney disease   . Hypertension   . PONV (postoperative nausea and vomiting)     Past Surgical History:  Procedure Laterality Date  . ABDOMINAL HYSTERECTOMY     complete 2011  . NEPHRECTOMY Left 04/06/2014   Procedure: NEPHRECTOMY;  Surgeon: Raynelle Bring, MD;  Location: [facility];  Service: Urology;  Laterality: Left;    Current Outpatient Medications on File Prior to Visit  Medication Sig Dispense Refill  . acetaminophen (TYLENOL) 500 MG tablet Take 1,000 mg by mouth once as needed for mild pain or headache.    . ALPRAZolam (XANAX) 0.5 MG tablet TAKE 1 TABLET BY MOUTH TWICE DAILY AS NEEDED FOR ANXIETY 30 tablet 3  . Cholecalciferol (VITAMIN D) 2000 units tablet Take 2,000 Units by mouth daily.    . cyclobenzaprine (FLEXERIL) 5 MG tablet TAKE 1 TABLET BY MOUTH 3 TIMES DAILY AS NEEDED FOR MUSCLE SPASMS. 30 tablet 1  . docusate sodium (COLACE) 100 MG capsule Take 1 capsule (100 mg total) by mouth 2 (two) times daily. 40 capsule 0  . estradiol (ESTRACE) 0.5 MG tablet TAKE 1 TABLET BY MOUTH EVERY EVENING 90 tablet 1  . metoprolol succinate (TOPROL-XL) 50 MG 24 hr tablet Take 1 tablet (50 mg total) by mouth daily. Please call [phone] to schedule a BP follow up 30 tablet 0  . ondansetron (ZOFRAN) 4 MG tablet Take 1 tablet (4  mg total) by mouth every 8 (eight) hours as needed for nausea or vomiting. 20 tablet 0  . pantoprazole (PROTONIX) 40 MG tablet TAKE 1 TABLET BY MOUTH ONCE DAILY 30 tablet 6  . traMADol (ULTRAM) 50 MG tablet Take 1 tablet (50 mg total) by mouth 2 (two) times daily as needed. 30 tablet 0  . vitamin B-12 (CYANOCOBALAMIN) 1000 MCG tablet Take 1,000 mcg by mouth daily.     No current facility-administered medications on file prior to visit.     Allergies  Allergen Reactions  . Neosporin [Neomycin-Bacitracin Zn-Polymyx] Rash  . Sulfa Antibiotics Rash    rash    Family History  Problem Relation Age of Onset  . Hypertension Mother   . Bipolar disorder Mother   . Hypertension Father   . Hypertension Maternal Grandmother   . Hyperlipidemia Maternal Grandmother   . Diabetes Maternal Grandmother   . Hyperlipidemia Maternal Grandfather   . Hypertension Maternal Grandfather     Social History   Socioeconomic History  . Marital status: Married    Spouse name: Not on file  . Number of children: Not on file  . Years of education: Not on file  . Highest education level: Not on file  Occupational History  . Not on file  Social Needs  . Financial resource strain: Not on file  . Food insecurity    Worry:  Not on file    Inability: Not on file  . Transportation needs    Medical: Not on file    Non-medical: Not on file  Tobacco Use  . Smoking status: Never Smoker  . Smokeless tobacco: Never Used  Substance and Sexual Activity  . Alcohol use: Yes    Alcohol/week: 2.0 - 3.0 standard drinks    Types: 2 - 3 Standard drinks or equivalent per week  . Drug use: No  . Sexual activity: Yes  Lifestyle  . Physical activity    Days per week: Not on file    Minutes per session: Not on file  . Stress: Not on file  Relationships  . Social Herbalist on phone: Not on file    Gets together: Not on file    Attends religious service: Not on file    Active member of club or  organization: Not on file    Attends meetings of clubs or organizations: Not on file    Relationship status: Not on file  . Intimate partner violence    Fear of current or ex partner: Not on file    Emotionally abused: Not on file    Physically abused: Not on file    Forced sexual activity: Not on file  Other Topics Concern  . Not on file  Social History Narrative  . Not on file   Review of Systems  Constitutional: Negative for fever and weight loss.  HENT: Negative for ear discharge, ear pain, hearing loss and tinnitus.   Eyes: Negative for blurred vision, double vision, photophobia and pain.  Respiratory: Negative for cough and shortness of breath.   Cardiovascular: Negative for chest pain and palpitations.  Gastrointestinal: Negative for abdominal pain, blood in stool, constipation, diarrhea, heartburn, melena, nausea and vomiting.  Genitourinary: Negative for dysuria, flank pain, frequency, hematuria and urgency.  Musculoskeletal: Negative for falls.  Neurological: Negative for dizziness, loss of consciousness and headaches.  Endo/Heme/Allergies: Negative for environmental allergies.  Psychiatric/Behavioral: Negative for depression, hallucinations, substance abuse and suicidal ideas. The patient is not nervous/anxious and does not have insomnia.    BP 118/80   Pulse 77   Temp 98 F (36.7 C) (Skin)   Resp 16   Ht 5\' 6"  (1.676 m)   Wt 213 lb (96.6 kg)   SpO2 99%   BMI 34.38 kg/m   Physical Exam Vitals signs reviewed.  HENT:     Head: Normocephalic and atraumatic.     Right Ear: Tympanic membrane, ear canal and external ear normal.     Left Ear: Tympanic membrane, ear canal and external ear normal.     Nose: Nose normal. No mucosal edema.     Mouth/Throat:     Pharynx: Uvula midline. No oropharyngeal exudate or posterior oropharyngeal erythema.  Eyes:     Conjunctiva/sclera: Conjunctivae normal.     Pupils: Pupils are equal, round, and reactive to light.  Neck:      Musculoskeletal: Neck supple.     Thyroid: No thyromegaly.  Cardiovascular:     Rate and Rhythm: Normal rate and regular rhythm.     Heart sounds: Normal heart sounds.  Pulmonary:     Effort: Pulmonary effort is normal. No respiratory distress.     Breath sounds: Normal breath sounds. No wheezing or rales.  Abdominal:     General: Bowel sounds are normal. There is no distension.     Palpations: Abdomen is soft. There is no mass.  Tenderness: There is no abdominal tenderness. There is no guarding or rebound.  Lymphadenopathy:     Cervical: No cervical adenopathy.  Skin:    General: Skin is warm and dry.     Findings: No rash.  Neurological:     Mental Status: She is alert and oriented to person, place, and time.     Cranial Nerves: No cranial nerve deficit.    Assessment/Plan: 1. Visit for preventive health examination Depression screen negative. Health Maintenance reviewed -- Prior HIV screen due to stick at work. Negative. Declines concerns warranting recheck.. Preventive schedule discussed and handout given in AVS. Will obtain fasting labs today.  - CBC with Differential/Platelet - Comprehensive metabolic panel - Hemoglobin A1c - Lipid panel - Urinalysis, Routine w reflex microscopic - TSH - B12  2. Labile blood pressure BP normotensive. Is keeping low salt diet and has lost some weight. Labs today. Medications refilled. Will monitor. Follow-up with PCP Q6 months.  - Comprehensive metabolic panel - Lipid panel  3. Gastroesophageal reflux disease, esophagitis presence not specified Stable. Would like to wean off of her Protonix if possible. Will have her decrease to QOD dosing for 1-2 weeks. If still asymptomatic, will attempt trial off of medication.  4. Obesity (BMI 35.0-39.9 without comorbidity) (HCC) Significant weight loss. Now Body mass index is 34.38 kg/m.Marland Kitchen Keep up with dietary and exercise interventions.  5. Anxiety state PRN Xanax. Stable. Will give 1  refill. Further refills to come from PCP.  6. Cervicalgia Long-standing. Intermittent flares. Cannot take NSAIDs due to solitary kidney. Takes tylenol for mild pain and has Rx for Tramadol from PCP to use sparingly for flares of pain. Will refill once today. Further refills to come from PCP.  7. Vegetarian On Iron supplement. Will check CBC and B12 levels today.  - CBC with Differential/Platelet - B12   Leeanne Rio, PA-C

## 2019-01-17 MED FILL — PANTOPRAZOLE SOD DR 40 MG T: 40 | 30 days supply | Qty: 30 | Fill #0

## 2019-03-04 ENCOUNTER — Other Ambulatory Visit: Payer: Self-pay | Admitting: Physician Assistant

## 2019-03-04 MED FILL — ESTRADIOL 0.5 MG TABS: 0.5 | 90 days supply | Qty: 90 | Fill #1

## 2019-03-04 MED FILL — PANTOPRAZOLE SOD DR 40 MG T: 40 | 90 days supply | Qty: 90 | Fill #0

## 2019-04-04 ENCOUNTER — Ambulatory Visit
Admission: RE | Admit: 2019-04-04 | Discharge: 2019-04-04 | Disposition: A | Discharge status: Home / Self Care | Payer: 59 | Source: Ambulatory Visit | Attending: Family Medicine | Admitting: Family Medicine

## 2019-04-04 ENCOUNTER — Other Ambulatory Visit: Payer: Self-pay

## 2019-04-04 DIAGNOSIS — Z1231 Encounter for screening mammogram for malignant neoplasm of breast: Secondary | ICD-10-CM | POA: Diagnosis not present

## 2019-06-02 ENCOUNTER — Other Ambulatory Visit: Payer: Self-pay | Admitting: Family Medicine

## 2019-06-02 MED FILL — ESTRADIOL 0.5 MG TABS: 0.5 | 90 days supply | Qty: 90 | Fill #0

## 2019-09-08 ENCOUNTER — Other Ambulatory Visit: Payer: Self-pay | Admitting: Family Medicine

## 2019-09-08 MED FILL — PANTOPRAZOLE SOD DR 40 MG T: 40 | 90 days supply | Qty: 90 | Fill #0

## 2019-09-08 MED FILL — ESTRADIOL 0.5 MG TABS: 0.5 | 90 days supply | Qty: 90 | Fill #1

## 2019-09-26 ENCOUNTER — Other Ambulatory Visit: Payer: Self-pay | Admitting: Family Medicine

## 2019-09-29 MED FILL — CYCLOBENZAPRINE HCL 5 MG TA: 5 | 10 days supply | Qty: 30 | Fill #0

## 2019-09-29 NOTE — Telephone Encounter (Signed)
Pt last seen 8/20 by PA. Please advise if ok to fill?

## 2019-10-03 ENCOUNTER — Other Ambulatory Visit: Payer: Self-pay | Admitting: Physician Assistant

## 2019-10-03 NOTE — Telephone Encounter (Signed)
She hasn't had this medication since July.  Is there a reason that she needs a refill at this time?  Since it is controlled, I need more info.Marland KitchenMarland Kitchen

## 2019-10-03 NOTE — Telephone Encounter (Signed)
Called pt and LMOVM to inform that I need to know why she needs this medication at this time?

## 2019-10-03 NOTE — Telephone Encounter (Signed)
Tramadol last rx 01/14/19 #30 for Cervicalgia LOV: 01/14/19 CPE

## 2019-10-06 ENCOUNTER — Encounter: Payer: Self-pay | Admitting: General Practice

## 2019-10-06 MED FILL — traMADol HCL 50 MG TABS: 50 | 15 days supply | Qty: 30 | Fill #0

## 2019-10-06 NOTE — Telephone Encounter (Signed)
Called pt again and LMOVM also sent pt a mychart message.

## 2019-11-04 DIAGNOSIS — H52223 Regular astigmatism, bilateral: Secondary | ICD-10-CM | POA: Diagnosis not present

## 2019-11-04 DIAGNOSIS — H524 Presbyopia: Secondary | ICD-10-CM | POA: Diagnosis not present

## 2019-11-04 DIAGNOSIS — H5213 Myopia, bilateral: Secondary | ICD-10-CM | POA: Diagnosis not present

## 2019-11-04 DIAGNOSIS — H16223 Keratoconjunctivitis sicca, not specified as Sjogren's, bilateral: Secondary | ICD-10-CM | POA: Diagnosis not present

## 2019-12-04 ENCOUNTER — Other Ambulatory Visit: Payer: Self-pay | Admitting: Physician Assistant

## 2019-12-04 ENCOUNTER — Other Ambulatory Visit: Payer: Self-pay | Admitting: Family Medicine

## 2019-12-04 NOTE — Telephone Encounter (Signed)
Will defer further refills of patient's medications to PCP  

## 2019-12-04 NOTE — Telephone Encounter (Signed)
Last OV 01/14/19 Alprazolam last filled 01/14/19 #30 with 0

## 2020-01-15 ENCOUNTER — Other Ambulatory Visit: Payer: Self-pay | Admitting: Family Medicine

## 2020-01-15 ENCOUNTER — Other Ambulatory Visit: Payer: Self-pay | Admitting: Physician Assistant

## 2020-01-15 MED FILL — ALPRAZolam 0.5 MG TABS: 0.5 | 15 days supply | Qty: 30 | Fill #0

## 2020-01-15 NOTE — Telephone Encounter (Signed)
Xanax last rx 12/04/19 #30 LOV: 01/14/19 CPE with Einar Pheasant Next OV: 01/20/20 CPE with you

## 2020-01-15 NOTE — Telephone Encounter (Signed)
Last OV 01/14/19 Alprazolam last filled 12/04/19 #30 with 0

## 2020-01-20 ENCOUNTER — Ambulatory Visit (INDEPENDENT_AMBULATORY_CARE_PROVIDER_SITE_OTHER): Payer: 59 | Admitting: Family Medicine

## 2020-01-20 ENCOUNTER — Encounter: Payer: Self-pay | Admitting: Family Medicine

## 2020-01-20 ENCOUNTER — Other Ambulatory Visit: Payer: Self-pay

## 2020-01-20 VITALS — BP 114/82 | HR 76 | Temp 98.0°F | Resp 16 | Ht 66.0 in | Wt 219.0 lb

## 2020-01-20 DIAGNOSIS — Z0000 Encounter for general adult medical examination without abnormal findings: Secondary | ICD-10-CM | POA: Diagnosis not present

## 2020-01-20 DIAGNOSIS — E669 Obesity, unspecified: Secondary | ICD-10-CM | POA: Diagnosis not present

## 2020-01-20 DIAGNOSIS — F411 Generalized anxiety disorder: Secondary | ICD-10-CM | POA: Diagnosis not present

## 2020-01-20 LAB — BASIC METABOLIC PANEL
BUN: 10 mg/dL (ref 6–23)
CO2: 27 mEq/L (ref 19–32)
Calcium: 9.7 mg/dL (ref 8.4–10.5)
Chloride: 101 mEq/L (ref 96–112)
Creatinine, Ser: 0.79 mg/dL (ref 0.40–1.20)
GFR: 77.61 mL/min (ref >60.00)
Glucose, Bld: 88 mg/dL (ref 70–99)
Potassium: 4.2 mEq/L (ref 3.5–5.1)
Sodium: 135 mEq/L (ref 135–145)

## 2020-01-20 LAB — LIPID PANEL
Cholesterol: 198 mg/dL (ref 0–200)
HDL: 58.3 mg/dL (ref >39.00)
LDL Cholesterol: 116 mg/dL — ABNORMAL HIGH (ref 0–99)
NonHDL: 139.32
Total CHOL/HDL Ratio: 3
Triglycerides: 116 mg/dL (ref 0.0–149.0)
VLDL: 23.2 mg/dL (ref 0.0–40.0)

## 2020-01-20 LAB — HEPATIC FUNCTION PANEL
ALT: 16 U/L (ref 0–35)
AST: 18 U/L (ref 0–37)
Albumin: 4.5 g/dL (ref 3.5–5.2)
Alkaline Phosphatase: 56 U/L (ref 39–117)
Bilirubin, Direct: 0.1 mg/dL (ref 0.0–0.3)
Total Bilirubin: 0.5 mg/dL (ref 0.2–1.2)
Total Protein: 7.1 g/dL (ref 6.0–8.3)

## 2020-01-20 LAB — CBC WITH DIFFERENTIAL/PLATELET
Basophils Absolute: 0 10*3/uL (ref 0.0–0.1)
Basophils Relative: 0.5 % (ref 0.0–3.0)
Eosinophils Absolute: 0.2 10*3/uL (ref 0.0–0.7)
Eosinophils Relative: 3.3 % (ref 0.0–5.0)
HCT: 43.8 % (ref 36.0–46.0)
Hemoglobin: 15.2 g/dL — ABNORMAL HIGH (ref 12.0–15.0)
Lymphocytes Relative: 28 % (ref 12.0–46.0)
Lymphs Abs: 1.4 10*3/uL (ref 0.7–4.0)
MCHC: 34.7 g/dL (ref 30.0–36.0)
MCV: 92.7 fl (ref 78.0–100.0)
Monocytes Absolute: 0.4 10*3/uL (ref 0.1–1.0)
Monocytes Relative: 8.2 % (ref 3.0–12.0)
Neutro Abs: 3 10*3/uL (ref 1.4–7.7)
Neutrophils Relative %: 60 % (ref 43.0–77.0)
Platelets: 227 10*3/uL (ref 150.0–400.0)
RBC: 4.72 Mil/uL (ref 3.87–5.11)
RDW: 13.2 % (ref 11.5–15.5)
WBC: 5 10*3/uL (ref 4.0–10.5)

## 2020-01-20 LAB — TSH: TSH: 2.01 u[IU]/mL (ref 0.35–4.50)

## 2020-01-20 NOTE — Assessment & Plan Note (Signed)
Deteriorated.  Pt has gained 6 lbs since last visit.  Stressed need for healthy diet and regular exercise.  Check labs to risk stratify.  Will follow.

## 2020-01-20 NOTE — Assessment & Plan Note (Signed)
Deteriorated.  Is now seeing EAP for counseling.  Did discuss daily medication but the idea of this stresses her.  She will consider it if symptoms worsen and reach out if needed.

## 2020-01-20 NOTE — Assessment & Plan Note (Signed)
Pt's PE WNL w/ exception of obesity.  UTD on mammo, immunizations.  Check labs.  Anticipatory guidance provided.

## 2020-01-20 NOTE — Patient Instructions (Signed)
Follow up in 1 year or as needed We'll notify you of your lab results and make any changes if needed Continue to work on healthy diet and regular exercise- you can do it! If you have worsening anxiety, please let me know and we can discuss medication Call with any questions or concerns Stay Safe!  Stay Healthy!!

## 2020-01-20 NOTE — Progress Notes (Signed)
   Subjective:    Patient ID: Jackie Mcconnell, female    DOB: 03-19-1971, 49 y.o.   MRN: [id number]  HPI CPE- UTD on mammo (due in October), no need for pap due to TAH.  UTD on Tdap, COVID   Review of Systems Patient reports no vision/ hearing changes, adenopathy,fever, persistant/recurrent hoarseness , swallowing issues, chest pain, palpitations, edema, persistant/recurrent cough, hemoptysis, dyspnea (rest/exertional/paroxysmal nocturnal), gastrointestinal bleeding (melena, rectal bleeding), abdominal pain, significant heartburn, bowel changes, GU symptoms (dysuria, hematuria, incontinence), Gyn symptoms (abnormal  bleeding, pain),  syncope, focal weakness, memory loss, numbness & tingling, skin/hair/nail changes, abnormal bruising or bleeding, or depression.   + worsening anxiety- having marital issues, started counseling, taking Alprazolam more regularly. + 6 lb weight gain  This visit occurred during the SARS-CoV-2 public health emergency.  Safety protocols were in place, including screening questions prior to the visit, additional usage of staff PPE, and extensive cleaning of exam room while observing appropriate contact time as indicated for disinfecting solutions.       Objective:   Physical Exam General Appearance:    Alert, cooperative, no distress, appears stated age, obese  Head:    Normocephalic, without obvious abnormality, atraumatic  Eyes:    PERRL, conjunctiva/corneas clear, EOM's intact, fundi    benign, both eyes  Ears:    Normal TM's and external ear canals, both ears  Nose:   Nares normal, septum midline, mucosa normal, no drainage    or sinus tenderness  Throat:   Lips, mucosa, and tongue normal; teeth and gums normal  Neck:   Supple, symmetrical, trachea midline, no adenopathy;    Thyroid: no enlargement/tenderness/nodules  Back:     Symmetric, no curvature, ROM normal, no CVA tenderness  Lungs:     Clear to auscultation bilaterally, respirations unlabored  Chest  Wall:    No tenderness or deformity   Heart:    Regular rate and rhythm, S1 and S2 normal, no murmur, rub   or gallop  Breast Exam:    Deferred to mammo  Abdomen:     Soft, non-tender, bowel sounds active all four quadrants,    no masses, no organomegaly  Genitalia:    Deferred  Rectal:    Extremities:   Extremities normal, atraumatic, no cyanosis or edema  Pulses:   2+ and symmetric all extremities  Skin:   Skin color, texture, turgor normal, no rashes or lesions  Lymph nodes:   Cervical, supraclavicular, and axillary nodes normal  Neurologic:   CNII-XII intact, normal strength, sensation and reflexes    throughout          Assessment & Plan:

## 2020-01-21 ENCOUNTER — Encounter: Payer: Self-pay | Admitting: General Practice

## 2020-02-02 ENCOUNTER — Other Ambulatory Visit: Payer: Self-pay | Admitting: Family Medicine

## 2020-02-02 MED FILL — ALPRAZolam 0.5 MG TABS: 0.5 | 15 days supply | Qty: 30 | Fill #0

## 2020-02-02 NOTE — Telephone Encounter (Signed)
Last OV 01/20/20 Alprazolam last filled 01/15/20 #30 with 0

## 2020-02-12 ENCOUNTER — Other Ambulatory Visit: Payer: Self-pay | Admitting: Physician Assistant

## 2020-02-12 ENCOUNTER — Other Ambulatory Visit: Payer: Self-pay | Admitting: Family Medicine

## 2020-02-12 MED FILL — traMADol HCL 50 MG TABS: 50 | 15 days supply | Qty: 30 | Fill #0

## 2020-02-12 NOTE — Telephone Encounter (Signed)
Tramadol LFD 10/06/19 #30 with no refills LOV 01/20/20

## 2020-03-07 MED FILL — ESTRADIOL 0.5 MG TABS: 0.5 | 90 days supply | Qty: 90 | Fill #1

## 2020-03-08 ENCOUNTER — Other Ambulatory Visit: Payer: Self-pay | Admitting: Family Medicine

## 2020-03-08 MED FILL — PANTOPRAZOLE SOD DR 40 MG T: 40 | 90 days supply | Qty: 90 | Fill #0

## 2020-03-20 DIAGNOSIS — Z20822 Contact with and (suspected) exposure to covid-19: Secondary | ICD-10-CM | POA: Diagnosis not present

## 2020-03-20 DIAGNOSIS — Z03818 Encounter for observation for suspected exposure to other biological agents ruled out: Secondary | ICD-10-CM | POA: Diagnosis not present

## 2020-03-29 ENCOUNTER — Other Ambulatory Visit: Payer: Self-pay | Admitting: Family Medicine

## 2020-03-29 MED FILL — ALPRAZolam 0.5 MG TABS: 0.5 | 15 days supply | Qty: 30 | Fill #0

## 2020-03-29 NOTE — Telephone Encounter (Signed)
Last OV 01/20/20 Alprazolam last filled 02/02/20 #30 with 0

## 2020-04-15 ENCOUNTER — Other Ambulatory Visit: Payer: Self-pay | Admitting: Family Medicine

## 2020-04-15 MED FILL — METOPROLOL SUCCINATE ER 50: 50 | 30 days supply | Qty: 30 | Fill #0

## 2020-04-15 MED FILL — traMADol HCL 50 MG TABS: 50 | 15 days supply | Qty: 30 | Fill #0

## 2020-04-15 MED FILL — ALPRAZolam 0.5 MG TABS: 0.5 | 15 days supply | Qty: 30 | Fill #0

## 2020-04-15 NOTE — Telephone Encounter (Signed)
Alprazolam LFD 03/29/20 #30 with no refills Tramadol LFD 02/12/20 #30 with no refills LOV 01/20/20 NOV 01/21/21 for cpe

## 2020-04-16 ENCOUNTER — Other Ambulatory Visit: Payer: Self-pay | Admitting: Emergency Medicine

## 2020-04-16 DIAGNOSIS — R0989 Other specified symptoms and signs involving the circulatory and respiratory systems: Secondary | ICD-10-CM

## 2020-04-16 MED ORDER — METOPROLOL SUCCINATE ER 50 MG PO TB24: 50.0000 mg | ORAL_TABLET | Freq: Every day | ORAL | 2 refills | Status: DC

## 2020-04-30 DIAGNOSIS — F419 Anxiety disorder, unspecified: Secondary | ICD-10-CM | POA: Diagnosis not present

## 2020-05-01 ENCOUNTER — Encounter: Payer: Self-pay | Admitting: Family Medicine

## 2020-05-03 ENCOUNTER — Other Ambulatory Visit: Payer: Self-pay | Admitting: Family Medicine

## 2020-05-03 MED ORDER — FLUOXETINE HCL 10 MG PO CAPS: 10.0000 mg | ORAL_CAPSULE | Freq: Every day | ORAL | 3 refills | Status: DC

## 2020-05-03 MED FILL — FLUoxetine HCL 10 MG CAPS: 10 | 30 days supply | Qty: 30 | Fill #0

## 2020-05-11 DIAGNOSIS — F419 Anxiety disorder, unspecified: Secondary | ICD-10-CM | POA: Diagnosis not present

## 2020-06-01 ENCOUNTER — Other Ambulatory Visit: Payer: Self-pay | Admitting: Family Medicine

## 2020-06-01 MED FILL — METOPROLOL SUCCINATE ER 50: 50 | 90 days supply | Qty: 90 | Fill #0

## 2020-06-01 MED FILL — PANTOPRAZOLE SOD DR 40 MG T: 40 | 90 days supply | Qty: 90 | Fill #1

## 2020-06-01 MED FILL — ESTRADIOL 0.5 MG TABS: 0.5 | 90 days supply | Qty: 90 | Fill #0

## 2020-06-02 DIAGNOSIS — F419 Anxiety disorder, unspecified: Secondary | ICD-10-CM | POA: Diagnosis not present

## 2020-06-03 ENCOUNTER — Other Ambulatory Visit: Payer: Self-pay | Admitting: Family Medicine

## 2020-06-03 DIAGNOSIS — Z1231 Encounter for screening mammogram for malignant neoplasm of breast: Secondary | ICD-10-CM

## 2020-06-08 DIAGNOSIS — F419 Anxiety disorder, unspecified: Secondary | ICD-10-CM | POA: Diagnosis not present

## 2020-06-15 ENCOUNTER — Other Ambulatory Visit: Payer: Self-pay | Admitting: Emergency Medicine

## 2020-06-15 ENCOUNTER — Telehealth: Payer: 59 | Admitting: Emergency Medicine

## 2020-06-15 DIAGNOSIS — L298 Other pruritus: Secondary | ICD-10-CM | POA: Diagnosis not present

## 2020-06-15 MED ORDER — TRIAMCINOLONE ACETONIDE 0.1 % EX CREA: 1.0000 "application " | TOPICAL_CREAM | Freq: Two times a day (BID) | CUTANEOUS | 0 refills | Status: DC

## 2020-06-15 MED FILL — TRIAMCINOLONE 0.1% CREAM: 0.1 | 15 days supply | Qty: 30 | Fill #0

## 2020-06-15 NOTE — Progress Notes (Signed)
E Visit for Rash  We are sorry that you are not feeling well. Here is how we plan to help!      Based on what you have shared with me your rash appears allergic or eczematous. I have prescribed a small amount of  Kenalog  0.1% cream. Apply 2 x a day to the affected area until resolution. Avoid use on the face   HOME CARE:   Take cool showers and avoid direct sunlight.  Apply cool compress or wet dressings.  Take a bath in an oatmeal bath.  Sprinkle content of one Aveeno packet under running faucet with comfortably warm water.  Bathe for 15-20 minutes, 1-2 times daily.  Pat dry with a towel. Do not rub the rash.  Use hydrocortisone cream.  Take an antihistamine like Benadryl for widespread rashes that itch.  The adult dose of Benadryl is 25-50 mg by mouth 4 times daily.  Caution:  This type of medication may cause sleepiness.  Do not drink alcohol, drive, or operate dangerous machinery while taking antihistamines.  Do not take these medications if you have prostate enlargement.  Read package instructions thoroughly on all medications that you take.  GET HELP RIGHT AWAY IF:   Symptoms don't go away after treatment.  Severe itching that persists.  If you rash spreads or swells.  If you rash begins to smell.  If it blisters and opens or develops a yellow-brown crust.  You develop a fever.  You have a sore throat.  You become short of breath.  MAKE SURE YOU:  Understand these instructions. Will watch your condition. Will get help right away if you are not doing well or get worse.  Thank you for choosing an e-visit. Your e-visit answers were reviewed by a board certified advanced clinical practitioner to complete your personal care plan. Depending upon the condition, your plan could have included both over the counter or prescription medications. Please review your pharmacy choice. Be sure that the pharmacy you have chosen is open so that you can pick up your  prescription now.  If there is a problem you may message your provider in [place] to have the prescription routed to another pharmacy. Your safety is important to [place]. If you have drug allergies check your prescription carefully.  For the next 24 hours, you can use MyChart to ask questions about today's visit, request a non-urgent call back, or ask for a work or school excuse from your e-visit provider. You will get an email in the next two days asking about your experience. I hope that your e-visit has been valuable and will speed your recovery.     **Please do not respond to this message unless you have follow up questions.** Greater than 5 but less than 10 minutes spent researching, coordinating, and implementing care for this patient today

## 2020-06-21 DIAGNOSIS — F419 Anxiety disorder, unspecified: Secondary | ICD-10-CM | POA: Diagnosis not present

## 2020-06-22 ENCOUNTER — Ambulatory Visit
Admission: RE | Admit: 2020-06-22 | Discharge: 2020-06-22 | Disposition: A | Discharge status: Home / Self Care | Payer: 59 | Source: Ambulatory Visit

## 2020-06-22 ENCOUNTER — Other Ambulatory Visit: Payer: Self-pay

## 2020-06-22 DIAGNOSIS — Z1231 Encounter for screening mammogram for malignant neoplasm of breast: Secondary | ICD-10-CM

## 2020-07-08 DIAGNOSIS — F419 Anxiety disorder, unspecified: Secondary | ICD-10-CM | POA: Diagnosis not present

## 2020-07-20 DIAGNOSIS — F419 Anxiety disorder, unspecified: Secondary | ICD-10-CM | POA: Diagnosis not present

## 2020-08-03 DIAGNOSIS — F419 Anxiety disorder, unspecified: Secondary | ICD-10-CM | POA: Diagnosis not present

## 2020-08-24 ENCOUNTER — Other Ambulatory Visit: Payer: Self-pay | Admitting: Family Medicine

## 2020-08-24 DIAGNOSIS — F419 Anxiety disorder, unspecified: Secondary | ICD-10-CM | POA: Diagnosis not present

## 2020-08-24 DIAGNOSIS — F411 Generalized anxiety disorder: Secondary | ICD-10-CM

## 2020-08-24 NOTE — Telephone Encounter (Signed)
Xanax last rx 04/15/20 #30 LOV: 01/20/20 CPE

## 2020-08-25 ENCOUNTER — Other Ambulatory Visit: Payer: Self-pay | Admitting: Family Medicine

## 2020-08-25 MED FILL — ALPRAZolam 0.5 MG TABS: 0.5 | 15 days supply | Qty: 30 | Fill #0

## 2020-08-30 ENCOUNTER — Encounter: Payer: Self-pay | Admitting: Family Medicine

## 2020-08-31 ENCOUNTER — Other Ambulatory Visit: Payer: Self-pay

## 2020-08-31 ENCOUNTER — Other Ambulatory Visit: Payer: Self-pay | Admitting: Family Medicine

## 2020-08-31 DIAGNOSIS — K219 Gastro-esophageal reflux disease without esophagitis: Secondary | ICD-10-CM

## 2020-08-31 MED ORDER — PANTOPRAZOLE SODIUM 40 MG PO TBEC: 40.0000 mg | DELAYED_RELEASE_TABLET | Freq: Every day | ORAL | 1 refills | Status: DC

## 2020-08-31 MED FILL — PANTOPRAZOLE SOD DR 40 MG T: 40 | 90 days supply | Qty: 90 | Fill #0

## 2020-09-07 DIAGNOSIS — F419 Anxiety disorder, unspecified: Secondary | ICD-10-CM | POA: Diagnosis not present

## 2020-09-17 ENCOUNTER — Other Ambulatory Visit (HOSPITAL_BASED_OUTPATIENT_CLINIC_OR_DEPARTMENT_OTHER): Payer: Self-pay

## 2020-09-17 NOTE — Telephone Encounter (Signed)
Ok for an in office visit?

## 2020-09-21 DIAGNOSIS — F419 Anxiety disorder, unspecified: Secondary | ICD-10-CM | POA: Diagnosis not present

## 2020-09-24 ENCOUNTER — Encounter: Payer: Self-pay | Admitting: Family Medicine

## 2020-09-24 ENCOUNTER — Ambulatory Visit: Payer: 59 | Admitting: Family Medicine

## 2020-09-24 ENCOUNTER — Other Ambulatory Visit: Payer: Self-pay

## 2020-09-24 VITALS — BP 123/85 | HR 79 | Temp 98.6°F | Resp 19 | Ht 66.0 in | Wt 221.2 lb

## 2020-09-24 DIAGNOSIS — M542 Cervicalgia: Secondary | ICD-10-CM | POA: Diagnosis not present

## 2020-09-24 DIAGNOSIS — F411 Generalized anxiety disorder: Secondary | ICD-10-CM

## 2020-09-24 DIAGNOSIS — R0982 Postnasal drip: Secondary | ICD-10-CM

## 2020-09-24 DIAGNOSIS — G8929 Other chronic pain: Secondary | ICD-10-CM | POA: Diagnosis not present

## 2020-09-24 DIAGNOSIS — M549 Dorsalgia, unspecified: Secondary | ICD-10-CM | POA: Diagnosis not present

## 2020-09-24 NOTE — Progress Notes (Signed)
   Subjective:    Patient ID: Jackie Mcconnell, female    DOB: 11/15/1970, 50 y.o.   MRN: [id number]  HPI Chronic cough- pt reports sxs have improved since starting Flonase.  Is clearing her throat less w/ addition of Flonase.  Continues to use daily antihistamine.  Pt is already on Protonix for PND.  Chronic back and neck pain- on Tramadol (due to 1 kidney) and Flexeril 5mg .  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  Improved somewhat w/ massage.  Needs note to use FSA for massages.  Now having trouble sleeping.  Anxiety- pt never started Prozac b/c she fears taking a daily medication.  Will use Alprazolam as needed.  Is now living in her own apartment.  She feels safe in this situation.  Has been separated x6 months.  In counseling and is going to start couples counseling to determine if the separation is permanent.  Review of Systems For ROS see HPI   This visit occurred during the SARS-CoV-2 public health emergency.  Safety protocols were in place, including screening questions prior to the visit, additional usage of staff PPE, and extensive cleaning of exam room while observing appropriate contact time as indicated for disinfecting solutions.       Objective:   Physical Exam Vitals reviewed.  Constitutional:      General: She is not in acute distress.    Appearance: Normal appearance. She is well-developed. She is obese. She is not ill-appearing.  HENT:     Head: Normocephalic and atraumatic.     Mouth/Throat:     Comments: + PND Eyes:     Conjunctiva/sclera: Conjunctivae normal.     Pupils: Pupils are equal, round, and reactive to light.  Neck:     Thyroid: No thyromegaly.  Cardiovascular:     Rate and Rhythm: Normal rate and regular rhythm.     Pulses: Normal pulses.     Heart sounds: Normal heart sounds. No murmur heard.   Pulmonary:     Effort: Pulmonary effort is normal. No respiratory distress.     Breath sounds: Normal breath  sounds.  Abdominal:     General: There is no distension.     Palpations: Abdomen is soft.     Tenderness: There is no abdominal tenderness.  Musculoskeletal:     Cervical back: Normal range of motion and neck supple.     Right lower leg: No edema.     Left lower leg: No edema.  Lymphadenopathy:     Cervical: No cervical adenopathy.  Skin:    General: Skin is warm and dry.  Neurological:     Mental Status: She is alert and oriented to person, place, and time.  Psychiatric:        Behavior: Behavior normal.           Assessment & Plan:

## 2020-09-24 NOTE — Assessment & Plan Note (Signed)
New.  This was likely the cause of her ongoing cough as sxs improved w/ addition of Flonase.  No med changes at this time.  Will follow.

## 2020-09-24 NOTE — Assessment & Plan Note (Signed)
Ongoing issue.  Never started Prozac.  Using Alprazolam as needed.  Currently in individual counseling and plans to start couples' counseling.  Encouraged her to reach out if she wants to reconsider a daily medication.  Pt expressed understanding and is in agreement w/ plan.

## 2020-09-24 NOTE — Patient Instructions (Signed)
Follow up as needed or as scheduled Continue the daily antihistamine and Flonase Drink plenty of fluids Get your massages for the back and neck pain Call with any questions or concerns Stay Safe!  Stay Healthy!! Elbert Ewings in there!!!

## 2020-09-24 NOTE — Assessment & Plan Note (Signed)
Ongoing issue for pt.  She only has 1 kidney so she is not able to take NSAIDs.  On Tramadol and Flexeril as needed.  Letter given to use FSA for massages.  Will continue to follow.

## 2020-10-01 ENCOUNTER — Other Ambulatory Visit (HOSPITAL_COMMUNITY): Payer: Self-pay

## 2020-10-01 MED ORDER — FLUOXETINE HCL 10 MG PO CAPS
10.0000 mg | ORAL_CAPSULE | Freq: Every day | ORAL | 2 refills | Status: DC
  Filled 2020-10-01: qty 30, 30d supply, fill #0

## 2020-10-01 MED FILL — Metoprolol Succinate Tab ER 24HR 50 MG (Tartrate Equiv): ORAL | 90 days supply | Qty: 90 | Fill #0 | Status: AC

## 2020-10-01 MED FILL — Pantoprazole Sodium EC Tab 40 MG (Base Equiv): ORAL | 90 days supply | Qty: 90 | Fill #0 | Status: CN

## 2020-10-01 MED FILL — Estradiol Tab 0.5 MG: ORAL | 90 days supply | Qty: 90 | Fill #0 | Status: AC

## 2020-10-02 ENCOUNTER — Other Ambulatory Visit (HOSPITAL_COMMUNITY): Payer: Self-pay

## 2020-10-02 MED ORDER — CARESTART COVID-19 HOME TEST VI KIT
PACK | 0 refills | Status: DC
  Filled 2020-10-02: qty 1, 1d supply, fill #0

## 2020-10-04 ENCOUNTER — Telehealth: Payer: Self-pay

## 2020-10-04 ENCOUNTER — Other Ambulatory Visit (HOSPITAL_COMMUNITY): Payer: Self-pay

## 2020-10-04 MED ORDER — CYCLOBENZAPRINE HCL 5 MG PO TABS
5.0000 mg | ORAL_TABLET | Freq: Three times a day (TID) | ORAL | 1 refills | Status: DC
  Filled 2020-10-04: qty 30, 10d supply, fill #0
  Filled 2021-06-03: qty 30, 10d supply, fill #1

## 2020-10-04 NOTE — Telephone Encounter (Signed)
We just sent her this last Tuesday 09/28/20. Is it ok to refill now?

## 2020-10-04 NOTE — Telephone Encounter (Signed)
According to the chart, it says that Jackie Mcconnell received the prescription refill on 4/5.  They should be able to fill this for her as directed

## 2020-10-04 NOTE — Telephone Encounter (Signed)
Spoke with someone in pharmacy and was told that it is being taken care of as of right now. Flexeril 5 mg 30 tabs 1 refill.

## 2020-10-04 NOTE — Telephone Encounter (Signed)
MEDICATION: cyclobenzaprine (FLEXERIL) 5 MG tablet  PHARMACY: [facility]  Comments: Pharmacy called stating pt requested for this prescription to be refilled. Pharmacy stated they have not filled this prescription for pt in a long time.  Please advise.   **Let patient know to contact pharmacy at the end of the day to make sure medication is ready. **  ** Please notify patient to allow 48-72 hours to process**  **Encourage patient to contact the pharmacy for refills or they can request refills through [facility]**

## 2020-10-06 DIAGNOSIS — F419 Anxiety disorder, unspecified: Secondary | ICD-10-CM | POA: Diagnosis not present

## 2020-10-07 ENCOUNTER — Other Ambulatory Visit (HOSPITAL_COMMUNITY): Payer: Self-pay

## 2020-10-07 ENCOUNTER — Other Ambulatory Visit: Payer: Self-pay | Admitting: Family Medicine

## 2020-10-07 DIAGNOSIS — F411 Generalized anxiety disorder: Secondary | ICD-10-CM

## 2020-10-07 MED ORDER — ALPRAZOLAM 0.5 MG PO TABS
ORAL_TABLET | Freq: Two times a day (BID) | ORAL | 0 refills | Status: DC | PRN
  Filled 2020-10-07: qty 30, 15d supply, fill #0

## 2020-10-07 NOTE — Telephone Encounter (Signed)
Requesting:Xanax 0.5mg  Contract: UDS: Last Visit:09/24/20 Next Visit:01/21/21 Last Refill:08/25/20 30 tabs 0 refills  Please Advise

## 2020-10-08 ENCOUNTER — Other Ambulatory Visit (HOSPITAL_COMMUNITY): Payer: Self-pay

## 2020-10-19 DIAGNOSIS — F419 Anxiety disorder, unspecified: Secondary | ICD-10-CM | POA: Diagnosis not present

## 2020-10-21 ENCOUNTER — Encounter: Payer: Self-pay | Admitting: Family Medicine

## 2020-11-02 ENCOUNTER — Other Ambulatory Visit (HOSPITAL_COMMUNITY): Payer: Self-pay

## 2020-11-09 ENCOUNTER — Other Ambulatory Visit (HOSPITAL_COMMUNITY): Payer: Self-pay

## 2020-11-09 ENCOUNTER — Other Ambulatory Visit: Payer: Self-pay | Admitting: Family Medicine

## 2020-11-09 DIAGNOSIS — F411 Generalized anxiety disorder: Secondary | ICD-10-CM

## 2020-11-09 DIAGNOSIS — F419 Anxiety disorder, unspecified: Secondary | ICD-10-CM | POA: Diagnosis not present

## 2020-11-09 MED ORDER — ALPRAZOLAM 0.5 MG PO TABS
ORAL_TABLET | Freq: Two times a day (BID) | ORAL | 0 refills | Status: DC | PRN
  Filled 2020-11-09: qty 30, 15d supply, fill #0

## 2020-11-09 NOTE — Telephone Encounter (Signed)
LFD 10/07/20 #30 with no refills LOV 09/24/20 NOV 01/28/21

## 2020-11-17 DIAGNOSIS — F4323 Adjustment disorder with mixed anxiety and depressed mood: Secondary | ICD-10-CM | POA: Diagnosis not present

## 2020-11-24 DIAGNOSIS — F419 Anxiety disorder, unspecified: Secondary | ICD-10-CM | POA: Diagnosis not present

## 2020-12-03 ENCOUNTER — Other Ambulatory Visit (HOSPITAL_COMMUNITY): Payer: Self-pay

## 2020-12-03 MED FILL — Pantoprazole Sodium EC Tab 40 MG (Base Equiv): ORAL | 90 days supply | Qty: 90 | Fill #0 | Status: AC

## 2020-12-07 DIAGNOSIS — F419 Anxiety disorder, unspecified: Secondary | ICD-10-CM | POA: Diagnosis not present

## 2020-12-21 DIAGNOSIS — F419 Anxiety disorder, unspecified: Secondary | ICD-10-CM | POA: Diagnosis not present

## 2020-12-22 ENCOUNTER — Encounter: Payer: Self-pay | Admitting: *Deleted

## 2020-12-28 ENCOUNTER — Other Ambulatory Visit (HOSPITAL_COMMUNITY): Payer: Self-pay

## 2020-12-28 ENCOUNTER — Other Ambulatory Visit: Payer: Self-pay | Admitting: Family Medicine

## 2020-12-28 DIAGNOSIS — F411 Generalized anxiety disorder: Secondary | ICD-10-CM

## 2020-12-28 MED ORDER — ALPRAZOLAM 0.5 MG PO TABS
ORAL_TABLET | Freq: Two times a day (BID) | ORAL | 0 refills | Status: DC | PRN
  Filled 2020-12-28: qty 30, 15d supply, fill #0

## 2020-12-28 MED ORDER — ESTRADIOL 0.5 MG PO TABS
ORAL_TABLET | Freq: Every evening | ORAL | 1 refills | Status: DC
  Filled 2020-12-28: qty 90, 90d supply, fill #0
  Filled 2021-04-14: qty 90, 90d supply, fill #1

## 2020-12-28 NOTE — Telephone Encounter (Signed)
Requesting:Xanax 0.5mg  Contract: UDS: Last Visit:09/24/20 Next Visit:01/28/21 Last Refill:11/09/20  30 tabs 0 refills  Please Advise

## 2021-01-04 DIAGNOSIS — F419 Anxiety disorder, unspecified: Secondary | ICD-10-CM | POA: Diagnosis not present

## 2021-01-21 ENCOUNTER — Encounter: Payer: 59 | Admitting: Family Medicine

## 2021-01-25 DIAGNOSIS — F419 Anxiety disorder, unspecified: Secondary | ICD-10-CM | POA: Diagnosis not present

## 2021-01-28 ENCOUNTER — Encounter: Payer: 59 | Admitting: Family Medicine

## 2021-02-02 ENCOUNTER — Encounter: Payer: Self-pay | Admitting: Registered Nurse

## 2021-02-02 ENCOUNTER — Ambulatory Visit (INDEPENDENT_AMBULATORY_CARE_PROVIDER_SITE_OTHER): Payer: 59 | Admitting: Registered Nurse

## 2021-02-02 ENCOUNTER — Other Ambulatory Visit (HOSPITAL_COMMUNITY): Payer: Self-pay

## 2021-02-02 ENCOUNTER — Other Ambulatory Visit: Payer: Self-pay

## 2021-02-02 VITALS — BP 115/58 | HR 54 | Temp 98.0°F | Resp 18 | Ht 66.0 in | Wt 221.0 lb

## 2021-02-02 DIAGNOSIS — Z1329 Encounter for screening for other suspected endocrine disorder: Secondary | ICD-10-CM | POA: Diagnosis not present

## 2021-02-02 DIAGNOSIS — Z6835 Body mass index (BMI) 35.0-35.9, adult: Secondary | ICD-10-CM

## 2021-02-02 DIAGNOSIS — F411 Generalized anxiety disorder: Secondary | ICD-10-CM | POA: Diagnosis not present

## 2021-02-02 DIAGNOSIS — R0982 Postnasal drip: Secondary | ICD-10-CM

## 2021-02-02 DIAGNOSIS — Z130 Encounter for screening for diseases of the blood and blood-forming organs and certain disorders involving the immune mechanism: Secondary | ICD-10-CM | POA: Diagnosis not present

## 2021-02-02 DIAGNOSIS — Z0000 Encounter for general adult medical examination without abnormal findings: Secondary | ICD-10-CM

## 2021-02-02 DIAGNOSIS — Z13228 Encounter for screening for other metabolic disorders: Secondary | ICD-10-CM

## 2021-02-02 DIAGNOSIS — Z1159 Encounter for screening for other viral diseases: Secondary | ICD-10-CM | POA: Diagnosis not present

## 2021-02-02 DIAGNOSIS — Z13220 Encounter for screening for lipoid disorders: Secondary | ICD-10-CM

## 2021-02-02 DIAGNOSIS — N644 Mastodynia: Secondary | ICD-10-CM

## 2021-02-02 LAB — CBC WITH DIFFERENTIAL/PLATELET
Basophils Absolute: 0 10*3/uL (ref 0.0–0.1)
Basophils Relative: 0.8 % (ref 0.0–3.0)
Eosinophils Absolute: 0.1 10*3/uL (ref 0.0–0.7)
Eosinophils Relative: 3 % (ref 0.0–5.0)
HCT: 42.4 % (ref 36.0–46.0)
Hemoglobin: 14.5 g/dL (ref 12.0–15.0)
Lymphocytes Relative: 29.8 % (ref 12.0–46.0)
Lymphs Abs: 1.5 10*3/uL (ref 0.7–4.0)
MCHC: 34.2 g/dL (ref 30.0–36.0)
MCV: 93 fl (ref 78.0–100.0)
Monocytes Absolute: 0.4 10*3/uL (ref 0.1–1.0)
Monocytes Relative: 8.2 % (ref 3.0–12.0)
Neutro Abs: 2.9 10*3/uL (ref 1.4–7.7)
Neutrophils Relative %: 58.2 % (ref 43.0–77.0)
Platelets: 222 10*3/uL (ref 150.0–400.0)
RBC: 4.56 Mil/uL (ref 3.87–5.11)
RDW: 13 % (ref 11.5–15.5)
WBC: 5 10*3/uL (ref 4.0–10.5)

## 2021-02-02 LAB — URINALYSIS
Bilirubin Urine: NEGATIVE
Hgb urine dipstick: NEGATIVE
Ketones, ur: NEGATIVE
Leukocytes,Ua: NEGATIVE
Nitrite: NEGATIVE
Specific Gravity, Urine: <=1.005 — AB (ref 1.000–1.030)
Total Protein, Urine: NEGATIVE
Urine Glucose: NEGATIVE
Urobilinogen, UA: 0.2 (ref 0.0–1.0)
pH: 6.5 (ref 5.0–8.0)

## 2021-02-02 LAB — COMPREHENSIVE METABOLIC PANEL
ALT: 14 U/L (ref 0–35)
AST: 15 U/L (ref 0–37)
Albumin: 4.4 g/dL (ref 3.5–5.2)
Alkaline Phosphatase: 62 U/L (ref 39–117)
BUN: 11 mg/dL (ref 6–23)
CO2: 26 mEq/L (ref 19–32)
Calcium: 9.3 mg/dL (ref 8.4–10.5)
Chloride: 102 mEq/L (ref 96–112)
Creatinine, Ser: 0.85 mg/dL (ref 0.40–1.20)
GFR: 80.54 mL/min (ref >60.00)
Glucose, Bld: 84 mg/dL (ref 70–99)
Potassium: 4.2 mEq/L (ref 3.5–5.1)
Sodium: 139 mEq/L (ref 135–145)
Total Bilirubin: 0.4 mg/dL (ref 0.2–1.2)
Total Protein: 6.9 g/dL (ref 6.0–8.3)

## 2021-02-02 LAB — LIPID PANEL
Cholesterol: 203 mg/dL — ABNORMAL HIGH (ref 0–200)
HDL: 61.3 mg/dL (ref >39.00)
LDL Cholesterol: 125 mg/dL — ABNORMAL HIGH (ref 0–99)
NonHDL: 142.06
Total CHOL/HDL Ratio: 3
Triglycerides: 87 mg/dL (ref 0.0–149.0)
VLDL: 17.4 mg/dL (ref 0.0–40.0)

## 2021-02-02 LAB — TSH: TSH: 3.06 u[IU]/mL (ref 0.35–5.50)

## 2021-02-02 LAB — HEMOGLOBIN A1C: Hgb A1c MFr Bld: 5.2 % (ref 4.6–6.5)

## 2021-02-02 MED ORDER — FLUTICASONE PROPIONATE 50 MCG/ACT NA SUSP
2.0000 | Freq: Every day | NASAL | 6 refills | Status: DC
  Filled 2021-02-02: qty 16, 30d supply, fill #0
  Filled 2021-03-01: qty 16, 30d supply, fill #1
  Filled 2021-12-16: qty 16, 30d supply, fill #2

## 2021-02-02 MED ORDER — ALPRAZOLAM 0.5 MG PO TABS
ORAL_TABLET | Freq: Two times a day (BID) | ORAL | 0 refills | Status: DC | PRN
  Filled 2021-02-02: qty 30, 15d supply, fill #0

## 2021-02-02 NOTE — Progress Notes (Signed)
Established Patient Office Visit  Subjective:  Patient ID: Jackie Mcconnell, female    DOB: [birth year]  Age: 50 y.o. MRN: [id number]  CC:  Chief Complaint  Patient presents with   Annual Exam    Patient states she is here for her annual physical.    HPI Jackie Mcconnell presents for CPE  Histories reviewed and updated with patient.   Notes breast tenderness, L breast 12 oclock from nipple. No distinct lump noted. Hx of one abnormal mammography, suggested for follow up, wnl at that time Paternal gma with hx of breast ca, dx in 27s.  Otherwise, needs refill on fluticasone nasal spray and alprazolam Using fluticasone twice daily Using alprazolam 2-3 times weekly. Understands r/b/se of this medication.  Notes ongoing neck and upper back pain Resistant to medication overuse- does occ use OTC analgesic or flexeril occ Works a lot on computer Has done PT in past - late 2019 early 2020. Good results. Potentially interested again  Weight - Struggled with this for some time Good diet, regular exercise when able Interested in healthy weight and wellness referral Also interested in medical intervention, specifically GLP-1  Past Medical History:  Diagnosis Date   Anxiety    due to school studies   Complication of anesthesia    GERD (gastroesophageal reflux disease) 08/08/2017   History of kidney disease    Hypertension    PONV (postoperative nausea and vomiting)     Past Surgical History:  Procedure Laterality Date   ABDOMINAL HYSTERECTOMY     complete 2011   NEPHRECTOMY Left 04/06/2014   Procedure: NEPHRECTOMY;  Surgeon: Raynelle Bring, MD;  Location: [facility];  Service: Urology;  Laterality: Left;    Family History  Problem Relation Age of Onset   Hypertension Mother    Bipolar disorder Mother    Hypertension Father    Hypertension Maternal Grandmother    Hyperlipidemia Maternal Grandmother    Diabetes Maternal Grandmother    Hyperlipidemia Maternal Grandfather     Hypertension Maternal Grandfather     Social History   Socioeconomic History   Marital status: Legally Separated    Spouse name: Not on file   Number of children: 1   Years of education: Not on file   Highest education level: Not on file  Occupational History   Not on file  Tobacco Use   Smoking status: Never   Smokeless tobacco: Never  Vaping Use   Vaping Use: Never used  Substance and Sexual Activity   Alcohol use: Yes    Alcohol/week: 2.0 - 3.0 standard drinks    Types: 2 - 3 Standard drinks or equivalent per week   Drug use: No   Sexual activity: Yes  Other Topics Concern   Not on file  Social History Narrative   Not on file   Social Determinants of Health   Financial Resource Strain: Not on file  Food Insecurity: Not on file  Transportation Needs: Not on file  Physical Activity: Not on file  Stress: Not on file  Social Connections: Not on file  Intimate Partner Violence: Not on file    Outpatient Medications Prior to Visit  Medication Sig Dispense Refill   acetaminophen (TYLENOL) 500 MG tablet Take 1,000 mg by mouth once as needed for mild pain or headache.     COVID-19 At Home Antigen Test (CARESTART COVID-19 HOME TEST) KIT USE AS DIRECTED WITHIN PACKAGE INSTRUCTIONS 2 each 0   cyclobenzaprine (FLEXERIL) 5 MG tablet TAKE 1 TABLET  BY MOUTH 3 TIMES DAILY AS NEEDED FOR MUSCLE SPASMS. 30 tablet 1   cyclobenzaprine (FLEXERIL) 5 MG tablet Take 1 tablet by mouth three times daily as needed for muscle spasms 30 tablet 1   docusate sodium (COLACE) 100 MG capsule Take 1 capsule (100 mg total) by mouth 2 (two) times daily. 40 capsule 0   estradiol (ESTRACE) 0.5 MG tablet Take 1 tablet by mouth every evening. 90 tablet 1   loratadine (CLARITIN) 10 MG tablet Take 10 mg by mouth daily.     metoprolol succinate (TOPROL-XL) 50 MG 24 hr tablet TAKE 1 TABLET BY MOUTH ONCE DAILY (Patient taking differently: Take 50 mg by mouth daily. Patient reports taking 52m) 90 tablet 2    Multiple Vitamin (MULTIVITAMIN) tablet Take 1 tablet by mouth daily.     ondansetron (ZOFRAN) 4 MG tablet Take 1 tablet (4 mg total) by mouth every 8 (eight) hours as needed for nausea or vomiting. 20 tablet 0   pantoprazole (PROTONIX) 40 MG tablet TAKE 1 TABLET BY MOUTH ONCE DAILY 90 tablet 1   ALPRAZolam (XANAX) 0.5 MG tablet Take 1 tablet by mouth 2 times daily as needed for anxiety. 30 tablet 0   pantoprazole (PROTONIX) 40 MG tablet TAKE 1 TABLET BY MOUTH ONCE A DAY 90 tablet 1   PFIZER-BIONTECH COVID-19 VACC 30 MCG/0.3ML injection  (Patient not taking: Reported on 02/02/2021)     triamcinolone (KENALOG) 0.1 % APPLY 1 APPLICATION TOPICALLY 2 TIMES DAILY. AVOID USE ON THE FACE. (Patient not taking: No sig reported) 30 g 0   Cholecalciferol (VITAMIN D) 2000 units tablet Take 2,000 Units by mouth daily. (Patient not taking: No sig reported)     FLUoxetine (PROZAC) 10 MG capsule Take 1 capsule (10 mg total) by mouth daily. 30 capsule 2   vitamin B-12 (CYANOCOBALAMIN) 1000 MCG tablet Take 1,000 mcg by mouth daily. (Patient not taking: Reported on 02/02/2021)     No facility-administered medications prior to visit.    Allergies  Allergen Reactions   Neosporin [Neomycin-Bacitracin Zn-Polymyx] Rash   Sulfa Antibiotics Rash    rash    ROS Review of Systems  Constitutional: Negative.   HENT: Negative.    Eyes: Negative.   Respiratory: Negative.    Cardiovascular: Negative.   Gastrointestinal: Negative.   Genitourinary: Negative.   Musculoskeletal: Negative.   Skin: Negative.   Neurological: Negative.   Psychiatric/Behavioral: Negative.    All other systems reviewed and are negative.    Objective:    Physical Exam Vitals and nursing note reviewed.  Constitutional:      General: She is not in acute distress.    Appearance: Normal appearance. She is obese. She is not ill-appearing, toxic-appearing or diaphoretic.  HENT:     Head: Normocephalic and atraumatic.     Right Ear:  Tympanic membrane, ear canal and external ear normal. There is no impacted cerumen.     Left Ear: Tympanic membrane, ear canal and external ear normal. There is no impacted cerumen.     Nose: Nose normal. No congestion or rhinorrhea.     Mouth/Throat:     Mouth: Mucous membranes are moist.     Pharynx: Oropharynx is clear. No oropharyngeal exudate or posterior oropharyngeal erythema.  Eyes:     General: No scleral icterus.       Right eye: No discharge.        Left eye: No discharge.     Extraocular Movements: Extraocular movements intact.  Conjunctiva/sclera: Conjunctivae normal.     Pupils: Pupils are equal, round, and reactive to light.  Cardiovascular:     Rate and Rhythm: Normal rate and regular rhythm.     Pulses: Normal pulses.     Heart sounds: Normal heart sounds. No murmur heard.   No friction rub. No gallop.  Pulmonary:     Effort: Pulmonary effort is normal. No respiratory distress.     Breath sounds: Normal breath sounds. No stridor. No wheezing, rhonchi or rales.  Chest:     Chest wall: Tenderness present. No mass, lacerations, deformity, swelling, crepitus or edema. There is no dullness to percussion.    Abdominal:     General: Abdomen is flat. Bowel sounds are normal. There is no distension.     Palpations: Abdomen is soft. There is no mass.     Tenderness: There is no abdominal tenderness. There is no right CVA tenderness, left CVA tenderness, guarding or rebound.     Hernia: No hernia is present.  Musculoskeletal:        General: No swelling, tenderness, deformity or signs of injury. Normal range of motion.     Right lower leg: No edema.     Left lower leg: No edema.  Skin:    General: Skin is warm and dry.     Capillary Refill: Capillary refill takes less than 2 seconds.     Coloration: Skin is not jaundiced or pale.     Findings: No bruising, erythema, lesion or rash.  Neurological:     General: No focal deficit present.     Mental Status: She is alert  and oriented to person, place, and time. Mental status is at baseline.     Cranial Nerves: No cranial nerve deficit.     Sensory: No sensory deficit.     Motor: No weakness.     Coordination: Coordination normal.     Gait: Gait normal.     Deep Tendon Reflexes: Reflexes normal.  Psychiatric:        Mood and Affect: Mood normal.        Behavior: Behavior normal.        Thought Content: Thought content normal.        Judgment: Judgment normal.    BP (!) 115/58   Pulse (!) 54   Temp 98 F (36.7 C) (Temporal)   Resp 18   Ht '5\' 6"'  (1.676 m)   Wt 221 lb (100.2 kg)   SpO2 100%   BMI 35.67 kg/m  Wt Readings from Last 3 Encounters:  02/02/21 221 lb (100.2 kg)  09/24/20 221 lb 3.2 oz (100.3 kg)  01/20/20 (!) 219 lb (99.3 kg)     Health Maintenance Due  Topic Date Due   Hepatitis C Screening  Never done   INFLUENZA VACCINE  01/24/2021    There are no preventive care reminders to display for this patient.  Lab Results  Component Value Date   TSH 2.01 01/20/2020   Lab Results  Component Value Date   WBC 5.0 01/20/2020   HGB 15.2 (H) 01/20/2020   HCT 43.8 01/20/2020   MCV 92.7 01/20/2020   PLT 227.0 01/20/2020   Lab Results  Component Value Date   NA 135 01/20/2020   K 4.2 01/20/2020   CO2 27 01/20/2020   GLUCOSE 88 01/20/2020   BUN 10 01/20/2020   CREATININE 0.79 01/20/2020   BILITOT 0.5 01/20/2020   ALKPHOS 56 01/20/2020   AST 18 01/20/2020  ALT 16 01/20/2020   PROT 7.1 01/20/2020   ALBUMIN 4.5 01/20/2020   CALCIUM 9.7 01/20/2020   ANIONGAP 10 04/08/2014   GFR 77.61 01/20/2020   Lab Results  Component Value Date   CHOL 198 01/20/2020   Lab Results  Component Value Date   HDL 58.30 01/20/2020   Lab Results  Component Value Date   LDLCALC 116 (H) 01/20/2020   Lab Results  Component Value Date   TRIG 116.0 01/20/2020   Lab Results  Component Value Date   CHOLHDL 3 01/20/2020   Lab Results  Component Value Date   HGBA1C 5.1 01/14/2019       Assessment & Plan:   Problem List Items Addressed This Visit       Other   Anxiety state   Relevant Medications   ALPRAZolam (XANAX) 0.5 MG tablet   PND (post-nasal drip)   Relevant Medications   fluticasone (FLONASE) 50 MCG/ACT nasal spray   Other Visit Diagnoses     Annual physical exam    -  Primary   Screening for viral disease       Relevant Orders   Hepatitis C Antibody   Screening for endocrine, metabolic and immunity disorder       Relevant Orders   CBC with Differential/Platelet   TSH   Hemoglobin A1c   Urinalysis   Comprehensive metabolic panel   Lipid screening       Relevant Orders   Lipid panel   Breast tenderness in female       Relevant Orders   MM Digital Diagnostic Bilat   BMI 35.0-35.9,adult       Relevant Orders   Amb Ref to Medical Weight Management       Meds ordered this encounter  Medications   fluticasone (FLONASE) 50 MCG/ACT nasal spray    Sig: Place 2 sprays into both nostrils daily.    Dispense:  16 g    Refill:  6    Order Specific Question:   Supervising Provider    Answer:   Carlota Raspberry, JEFFREY R [[id number]]   ALPRAZolam (XANAX) 0.5 MG tablet    Sig: Take 1 tablet by mouth 2 times daily as needed for anxiety.    Dispense:  30 tablet    Refill:  0    Order Specific Question:   Supervising Provider    Answer:   Carlota Raspberry, JEFFREY R [[id number]]    Follow-up: Return in about 1 year (around 02/02/2022) for CPE and labs.   PLAN Refill fluticasone and alprazolam as above Refer to medical weight management for BMI 35 Exam shows area of breast tenderness as detailed. Will order diagnostic mammography and follow up as warranted. Otherwise exam unremarkable Reviewed nonpharm injury prevention with patient including stretching  Discussed semaglutide weekly injection for weight loss at length including risks, benefits, and side effects, pt voices understanding. Can start if patient desires. Labs collected. Will follow up with the patient as  warranted. Patient encouraged to call clinic with any questions, comments, or concerns.  Maximiano Coss, NP

## 2021-02-02 NOTE — Patient Instructions (Addendum)
Ms Jackie Mcconnell to meet you  Labs should be back today  Med refills on alprazolam and fluticasone sent  Referral to Healthy Weight and Wellness sent  Mammography order sent  Remember to stretch!   Let me know if you need anything  Thank you  Rich    If you have lab work done today you will be contacted with your lab results within the next 2 weeks.  If you have not heard from [place] then please contact us. The fastest way to get your results is to register for My Chart.   IF you received an x-ray today, you will receive an invoice from [facility]. Please contact [facility] at [phone] with questions or concerns regarding your invoice.   IF you received labwork today, you will receive an invoice from [facility]. Please contact [facility] at [phone] with questions or concerns regarding your invoice.   Our billing staff will not be able to assist you with questions regarding bills from these companies.  You will be contacted with the lab results as soon as they are available. The fastest way to get your results is to activate your My Chart account. Instructions are located on the last page of this paperwork. If you have not heard from [place] regarding the results in 2 weeks, please contact this office.

## 2021-02-03 LAB — HEPATITIS C ANTIBODY
Hepatitis C Ab: NONREACTIVE
SIGNAL TO CUT-OFF: 0.01 (ref <1.00)

## 2021-02-04 ENCOUNTER — Other Ambulatory Visit: Payer: Self-pay | Admitting: Registered Nurse

## 2021-02-04 DIAGNOSIS — N644 Mastodynia: Secondary | ICD-10-CM

## 2021-02-19 ENCOUNTER — Other Ambulatory Visit: Payer: Self-pay

## 2021-02-19 ENCOUNTER — Ambulatory Visit: Payer: 59

## 2021-02-19 ENCOUNTER — Ambulatory Visit
Admission: RE | Admit: 2021-02-19 | Discharge: 2021-02-19 | Disposition: A | Discharge status: Home / Self Care | Payer: 59 | Source: Ambulatory Visit | Attending: Registered Nurse | Admitting: Registered Nurse

## 2021-02-19 DIAGNOSIS — R928 Other abnormal and inconclusive findings on diagnostic imaging of breast: Secondary | ICD-10-CM | POA: Diagnosis not present

## 2021-02-19 DIAGNOSIS — N644 Mastodynia: Secondary | ICD-10-CM

## 2021-02-23 DIAGNOSIS — F419 Anxiety disorder, unspecified: Secondary | ICD-10-CM | POA: Diagnosis not present

## 2021-03-01 ENCOUNTER — Other Ambulatory Visit (HOSPITAL_COMMUNITY): Payer: Self-pay

## 2021-03-01 MED FILL — Metoprolol Succinate Tab ER 24HR 50 MG (Tartrate Equiv): ORAL | 90 days supply | Qty: 90 | Fill #1 | Status: AC

## 2021-03-01 MED FILL — Pantoprazole Sodium EC Tab 40 MG (Base Equiv): ORAL | 90 days supply | Qty: 90 | Fill #0 | Status: AC

## 2021-03-15 DIAGNOSIS — F419 Anxiety disorder, unspecified: Secondary | ICD-10-CM | POA: Diagnosis not present

## 2021-03-30 DIAGNOSIS — F419 Anxiety disorder, unspecified: Secondary | ICD-10-CM | POA: Diagnosis not present

## 2021-04-12 DIAGNOSIS — F419 Anxiety disorder, unspecified: Secondary | ICD-10-CM | POA: Diagnosis not present

## 2021-04-14 ENCOUNTER — Other Ambulatory Visit (HOSPITAL_COMMUNITY): Payer: Self-pay

## 2021-04-15 ENCOUNTER — Other Ambulatory Visit (HOSPITAL_COMMUNITY): Payer: Self-pay

## 2021-04-20 ENCOUNTER — Telehealth: Payer: Self-pay

## 2021-04-20 ENCOUNTER — Encounter: Payer: Self-pay | Admitting: Registered Nurse

## 2021-04-20 ENCOUNTER — Ambulatory Visit: Payer: 59 | Admitting: Registered Nurse

## 2021-04-20 ENCOUNTER — Other Ambulatory Visit (HOSPITAL_COMMUNITY): Payer: Self-pay

## 2021-04-20 ENCOUNTER — Other Ambulatory Visit: Payer: Self-pay

## 2021-04-20 VITALS — BP 120/75 | HR 66 | Temp 98.1°F | Resp 18 | Ht 66.0 in | Wt 219.2 lb

## 2021-04-20 DIAGNOSIS — B369 Superficial mycosis, unspecified: Secondary | ICD-10-CM

## 2021-04-20 DIAGNOSIS — K219 Gastro-esophageal reflux disease without esophagitis: Secondary | ICD-10-CM | POA: Diagnosis not present

## 2021-04-20 DIAGNOSIS — R0982 Postnasal drip: Secondary | ICD-10-CM

## 2021-04-20 MED ORDER — AZELASTINE HCL 0.1 % NA SOLN
1.0000 | Freq: Two times a day (BID) | NASAL | 12 refills | Status: DC
  Filled 2021-04-20: qty 30, 25d supply, fill #0

## 2021-04-20 MED ORDER — SUCRALFATE 1 G PO TABS
1.0000 g | ORAL_TABLET | Freq: Three times a day (TID) | ORAL | 0 refills | Status: DC
  Filled 2021-04-20: qty 40, 10d supply, fill #0

## 2021-04-20 MED ORDER — FEXOFENADINE HCL 180 MG PO TABS
180.0000 mg | ORAL_TABLET | Freq: Every day | ORAL | 1 refills | Status: DC
  Filled 2021-04-20 – 2021-12-16 (×2): qty 90, 90d supply, fill #0

## 2021-04-20 NOTE — Patient Instructions (Addendum)
Ms. Biggins -   Doristine Devoid to see you!  Try switching loratadine to fexofenadine.  Can use azelastine daily - remember how to use nasal sprays most effectively! Can try sucralfate if above are not effective.  Let me know if things get worse!  Thanks,  Rich     If you have lab work done today you will be contacted with your lab results within the next 2 weeks.  If you have not heard from [place] then please contact us. The fastest way to get your results is to register for My Chart.   IF you received an x-ray today, you will receive an invoice from [facility]. Please contact [facility] at [phone] with questions or concerns regarding your invoice.   IF you received labwork today, you will receive an invoice from [facility]. Please contact [facility] at [phone] with questions or concerns regarding your invoice.   Our billing staff will not be able to assist you with questions regarding bills from these companies.  You will be contacted with the lab results as soon as they are available. The fastest way to get your results is to activate your My Chart account. Instructions are located on the last page of this paperwork. If you have not heard from [place] regarding the results in 2 weeks, please contact this office.

## 2021-04-20 NOTE — Progress Notes (Addendum)
Established Patient Office Visit  Subjective:  Patient ID: Jackie Mcconnell, female    DOB: 12-02-1970  Age: 50 y.o. MRN: [id number]  CC:  Chief Complaint  Patient presents with   Follow-up    Patient states she is still having the same cough , nasal drip and acid reflux. Patient states she is currently getting worse with all the medications she is taking. Also a rash under right breast.    HPI Jackie Mcconnell presents for follow up Ongoing cough, nasal drip, and acid reflux.   Nasal congestion/pnd: using flonase, claritin daily.  Cough: r/t to pnd or GERD, not taking antitussives GERD: taking pantoprazole 72m po qd. Feels like reflux symptoms fairly well controlled.  Does not feel too much congestion. Irritation in back of throat. Worse with more talking. Had been seen by Dr. TBirdie Riddlesome time ago.  Feels some hoarseness in throat.   No lower respiratory symptoms.   Rash Under breasts, R>L Red, irritated Hx of fungal infection  Past Medical History:  Diagnosis Date   Anxiety    due to school studies   Complication of anesthesia    GERD (gastroesophageal reflux disease) 08/08/2017   History of kidney disease    Hypertension    PONV (postoperative nausea and vomiting)     Past Surgical History:  Procedure Laterality Date   ABDOMINAL HYSTERECTOMY     complete 2011   NEPHRECTOMY Left 04/06/2014   Procedure: NEPHRECTOMY;  Surgeon: LRaynelle Bring MD;  Location: [facility];  Service: Urology;  Laterality: Left;    Family History  Problem Relation Age of Onset   Hypertension Mother    Bipolar disorder Mother    Hypertension Father    Hypertension Maternal Grandmother    Hyperlipidemia Maternal Grandmother    Diabetes Maternal Grandmother    Hyperlipidemia Maternal Grandfather    Hypertension Maternal Grandfather     Social History   Socioeconomic History   Marital status: Legally Separated    Spouse name: Not on file   Number of children: 1   Years of  education: Not on file   Highest education level: Not on file  Occupational History   Not on file  Tobacco Use   Smoking status: Never   Smokeless tobacco: Never  Vaping Use   Vaping Use: Never used  Substance and Sexual Activity   Alcohol use: Yes    Alcohol/week: 2.0 - 3.0 standard drinks    Types: 2 - 3 Standard drinks or equivalent per week   Drug use: No   Sexual activity: Yes  Other Topics Concern   Not on file  Social History Narrative   Not on file   Social Determinants of Health   Financial Resource Strain: Not on file  Food Insecurity: Not on file  Transportation Needs: Not on file  Physical Activity: Not on file  Stress: Not on file  Social Connections: Not on file  Intimate Partner Violence: Not on file    Outpatient Medications Prior to Visit  Medication Sig Dispense Refill   acetaminophen (TYLENOL) 500 MG tablet Take 1,000 mg by mouth once as needed for mild pain or headache.     ALPRAZolam (XANAX) 0.5 MG tablet Take 1 tablet by mouth 2 times daily as needed for anxiety. 30 tablet 0   COVID-19 At Home Antigen Test (CARESTART COVID-19 HOME TEST) KIT USE AS DIRECTED WITHIN PACKAGE INSTRUCTIONS 2 each 0   cyclobenzaprine (FLEXERIL) 5 MG tablet TAKE 1 TABLET BY MOUTH 3  TIMES DAILY AS NEEDED FOR MUSCLE SPASMS. 30 tablet 1   cyclobenzaprine (FLEXERIL) 5 MG tablet Take 1 tablet by mouth three times daily as needed for muscle spasms 30 tablet 1   docusate sodium (COLACE) 100 MG capsule Take 1 capsule (100 mg total) by mouth 2 (two) times daily. 40 capsule 0   estradiol (ESTRACE) 0.5 MG tablet Take 1 tablet by mouth every evening. 90 tablet 1   fluticasone (FLONASE) 50 MCG/ACT nasal spray Place 2 sprays into both nostrils daily. 16 g 6   loratadine (CLARITIN) 10 MG tablet Take 10 mg by mouth daily.     metoprolol succinate (TOPROL-XL) 50 MG 24 hr tablet TAKE 1 TABLET BY MOUTH ONCE DAILY (Patient taking differently: Take 50 mg by mouth daily. Patient reports taking 15m)  90 tablet 2   Multiple Vitamin (MULTIVITAMIN) tablet Take 1 tablet by mouth daily.     ondansetron (ZOFRAN) 4 MG tablet Take 1 tablet (4 mg total) by mouth every 8 (eight) hours as needed for nausea or vomiting. 20 tablet 0   pantoprazole (PROTONIX) 40 MG tablet TAKE 1 TABLET BY MOUTH ONCE DAILY 90 tablet 1   PFIZER-BIONTECH COVID-19 VACC 30 MCG/0.3ML injection      triamcinolone (KENALOG) 0.1 % APPLY 1 APPLICATION TOPICALLY 2 TIMES DAILY. AVOID USE ON THE FACE. 30 g 0   No facility-administered medications prior to visit.    Allergies  Allergen Reactions   Neosporin [Neomycin-Bacitracin Zn-Polymyx] Rash   Sulfa Antibiotics Rash    rash    ROS Review of Systems  Constitutional: Negative.   HENT:  Positive for postnasal drip. Negative for congestion, dental problem, drooling, ear discharge, ear pain, facial swelling, hearing loss, mouth sores, nosebleeds, rhinorrhea, sinus pressure, sinus pain, sneezing, sore throat, tinnitus, trouble swallowing and voice change.   Eyes: Negative.   Respiratory:  Positive for cough. Negative for apnea, choking, chest tightness, shortness of breath, wheezing and stridor.   Cardiovascular: Negative.   Gastrointestinal: Negative.   Endocrine: Negative.   Genitourinary: Negative.   Musculoskeletal: Negative.   Skin:  Positive for rash. Negative for color change, pallor and wound.  Allergic/Immunologic: Negative.   Neurological: Negative.   Psychiatric/Behavioral: Negative.    All other systems reviewed and are negative.    Objective:    Physical Exam Vitals and nursing note reviewed.  Constitutional:      General: She is not in acute distress.    Appearance: Normal appearance. She is normal weight. She is not ill-appearing, toxic-appearing or diaphoretic.  HENT:     Mouth/Throat:     Mouth: Mucous membranes are moist.     Pharynx: Oropharynx is clear. No oropharyngeal exudate or posterior oropharyngeal erythema.  Cardiovascular:     Rate and  Rhythm: Normal rate and regular rhythm.     Heart sounds: Normal heart sounds. No murmur heard.   No friction rub. No gallop.  Pulmonary:     Effort: Pulmonary effort is normal. No respiratory distress.     Breath sounds: Normal breath sounds. No stridor. No wheezing, rhonchi or rales.  Chest:     Chest wall: No tenderness.  Skin:    General: Skin is warm and dry.     Findings: Rash (red flat rash, well circumscribed, under L breast towards L axilla.) present.  Neurological:     General: No focal deficit present.     Mental Status: She is alert and oriented to person, place, and time. Mental status is at baseline.  Psychiatric:        Mood and Affect: Mood normal.        Behavior: Behavior normal.        Thought Content: Thought content normal.        Judgment: Judgment normal.    BP 120/75   Pulse 66   Temp 98.1 F (36.7 C) (Temporal)   Resp 18   Ht 5' 6" (1.676 m)   Wt 219 lb 3.2 oz (99.4 kg)   SpO2 100%   BMI 35.38 kg/m  Wt Readings from Last 3 Encounters:  04/20/21 219 lb 3.2 oz (99.4 kg)  02/02/21 221 lb (100.2 kg)  09/24/20 221 lb 3.2 oz (100.3 kg)     There are no preventive care reminders to display for this patient.  There are no preventive care reminders to display for this patient.  Lab Results  Component Value Date   TSH 3.06 02/02/2021   Lab Results  Component Value Date   WBC 5.0 02/02/2021   HGB 14.5 02/02/2021   HCT 42.4 02/02/2021   MCV 93.0 02/02/2021   PLT 222.0 02/02/2021   Lab Results  Component Value Date   NA 139 02/02/2021   K 4.2 02/02/2021   CO2 26 02/02/2021   GLUCOSE 84 02/02/2021   BUN 11 02/02/2021   CREATININE 0.85 02/02/2021   BILITOT 0.4 02/02/2021   ALKPHOS 62 02/02/2021   AST 15 02/02/2021   ALT 14 02/02/2021   PROT 6.9 02/02/2021   ALBUMIN 4.4 02/02/2021   CALCIUM 9.3 02/02/2021   ANIONGAP 10 04/08/2014   GFR 80.54 02/02/2021   Lab Results  Component Value Date   CHOL 203 (H) 02/02/2021   Lab Results   Component Value Date   HDL 61.30 02/02/2021   Lab Results  Component Value Date   LDLCALC 125 (H) 02/02/2021   Lab Results  Component Value Date   TRIG 87.0 02/02/2021   Lab Results  Component Value Date   CHOLHDL 3 02/02/2021   Lab Results  Component Value Date   HGBA1C 5.2 02/02/2021      Assessment & Plan:   Problem List Items Addressed This Visit       Digestive   GERD (gastroesophageal reflux disease)   Relevant Medications   sucralfate (CARAFATE) 1 g tablet     Other   PND (post-nasal drip) - Primary   Relevant Medications   azelastine (ASTELIN) 0.1 % nasal spray   fexofenadine (ALLEGRA ALLERGY) 180 MG tablet   Other Visit Diagnoses     Fungal rash of torso       Relevant Medications   fluconazole (DIFLUCAN) 150 MG tablet   nystatin (MYCOSTATIN/NYSTOP) powder       Meds ordered this encounter  Medications   azelastine (ASTELIN) 0.1 % nasal spray    Sig: Place 1 spray into both nostrils 2 (two) times daily.    Dispense:  30 mL    Refill:  12    Order Specific Question:   Supervising Provider    Answer:   Carlota Raspberry, JEFFREY R [[id number]]   sucralfate (CARAFATE) 1 g tablet    Sig: Take 1 tablet by mouth 4 times daily with meals and at bedtime.    Dispense:  40 tablet    Refill:  0    Order Specific Question:   Supervising Provider    Answer:   Carlota Raspberry, JEFFREY R [[id number]]   fexofenadine (ALLEGRA ALLERGY) 180 MG tablet    Sig: Take 1 tablet (  180 mg total) by mouth daily.    Dispense:  90 tablet    Refill:  1    Order Specific Question:   Supervising Provider    Answer:   Carlota Raspberry, JEFFREY R [[id number]]   fluconazole (DIFLUCAN) 150 MG tablet    Sig: Take 1 tablet (150 mg total) by mouth once for 1 dose. Repeat in 3 days if symptoms persist.    Dispense:  2 tablet    Refill:  0    Order Specific Question:   Supervising Provider    Answer:   Carlota Raspberry, JEFFREY R [[id number]]   nystatin (MYCOSTATIN/NYSTOP) powder    Sig: Apply 1 application topically 3 (three) times  daily.    Dispense:  15 g    Refill:  0    Order Specific Question:   Supervising Provider    Answer:   Carlota Raspberry, JEFFREY R [[id number]]    Follow-up: Return if symptoms worsen or fail to improve.   PLAN Can try azelastine for relief. Add sucralfate if no effect Can consider changing daily antihistamine if she would like Can consider ENT or allergy referral if she would like Rash appears fungal. Will give diflucan with option to repeat if persistent, nystatin for topical. Reviewed nonpharm relief for rash.  Patient encouraged to call clinic with any questions, comments, or concerns.  Maximiano Coss, NP

## 2021-04-20 NOTE — Telephone Encounter (Signed)
Caller name:Iveliz Lalli   On DPR? :Yes  Call back [place]  Provider they see: Maximiano Coss   Reason for call:Pt is calling she seen Richard today for a cough and pt talked about rash under her breast and he was going to give her Nystatin powder and a oral antifungal med nothing was placed in the summery and pt said nothing got sent to the pharmacy the pt is wanting to remind Rich if he can send when he comes in tomorrow morning.

## 2021-04-21 ENCOUNTER — Other Ambulatory Visit (HOSPITAL_COMMUNITY): Payer: Self-pay

## 2021-04-21 MED ORDER — NYSTATIN 100000 UNIT/GM EX POWD
1.0000 "application " | Freq: Three times a day (TID) | CUTANEOUS | 0 refills | Status: DC
  Filled 2021-04-21: qty 15, 5d supply, fill #0

## 2021-04-21 MED ORDER — FLUCONAZOLE 150 MG PO TABS
150.0000 mg | ORAL_TABLET | Freq: Once | ORAL | 0 refills | Status: AC
  Filled 2021-04-21: qty 2, 3d supply, fill #0

## 2021-04-21 NOTE — Telephone Encounter (Signed)
Apologies to Ms. Scalf -   Meds have been sent  Thanks,  Denice Paradise

## 2021-04-21 NOTE — Telephone Encounter (Signed)
I called and spoke with the patient and informed her that her medications ws sent to her pharmacy and I apologized to the patient for any inconvenience. Patient understood. Malahki Gasaway,cma

## 2021-04-21 NOTE — Addendum Note (Signed)
Addended by: Maximiano Coss on: 04/21/2021 08:37 AM   Modules accepted: Orders

## 2021-04-25 DIAGNOSIS — F419 Anxiety disorder, unspecified: Secondary | ICD-10-CM | POA: Diagnosis not present

## 2021-05-05 ENCOUNTER — Other Ambulatory Visit: Payer: Self-pay | Admitting: Registered Nurse

## 2021-05-05 ENCOUNTER — Other Ambulatory Visit (HOSPITAL_COMMUNITY): Payer: Self-pay

## 2021-05-05 DIAGNOSIS — K219 Gastro-esophageal reflux disease without esophagitis: Secondary | ICD-10-CM

## 2021-05-05 MED ORDER — SUCRALFATE 1 G PO TABS
1.0000 g | ORAL_TABLET | Freq: Three times a day (TID) | ORAL | 0 refills | Status: DC
  Filled 2021-05-05 – 2021-05-16 (×2): qty 40, 10d supply, fill #0

## 2021-05-05 MED ORDER — SUCRALFATE 1 GM/10ML PO SUSP
1.0000 g | Freq: Three times a day (TID) | ORAL | 0 refills | Status: DC
  Filled 2021-05-05 – 2021-05-16 (×2): qty 420, 11d supply, fill #0

## 2021-05-09 ENCOUNTER — Other Ambulatory Visit (HOSPITAL_COMMUNITY): Payer: Self-pay

## 2021-05-11 DIAGNOSIS — F419 Anxiety disorder, unspecified: Secondary | ICD-10-CM | POA: Diagnosis not present

## 2021-05-16 ENCOUNTER — Other Ambulatory Visit (HOSPITAL_COMMUNITY): Payer: Self-pay

## 2021-05-24 ENCOUNTER — Other Ambulatory Visit (HOSPITAL_COMMUNITY): Payer: Self-pay

## 2021-05-24 ENCOUNTER — Other Ambulatory Visit: Payer: Self-pay | Admitting: Family Medicine

## 2021-05-25 ENCOUNTER — Other Ambulatory Visit (HOSPITAL_COMMUNITY): Payer: Self-pay

## 2021-05-25 MED ORDER — TRAMADOL HCL 50 MG PO TABS
ORAL_TABLET | Freq: Two times a day (BID) | ORAL | 0 refills | Status: AC | PRN
  Filled 2021-05-25: qty 30, 15d supply, fill #0

## 2021-05-26 ENCOUNTER — Encounter: Payer: Self-pay | Admitting: Registered Nurse

## 2021-05-26 ENCOUNTER — Other Ambulatory Visit (HOSPITAL_COMMUNITY): Payer: Self-pay

## 2021-05-26 ENCOUNTER — Other Ambulatory Visit: Payer: Self-pay | Admitting: Registered Nurse

## 2021-05-26 DIAGNOSIS — K219 Gastro-esophageal reflux disease without esophagitis: Secondary | ICD-10-CM

## 2021-05-26 NOTE — Telephone Encounter (Signed)
Goal for this was short term use. If symptoms are persistent requiring longer use, I would have her follow up with GI  Thanks  Rich

## 2021-05-27 ENCOUNTER — Other Ambulatory Visit (HOSPITAL_COMMUNITY): Payer: Self-pay

## 2021-05-27 MED ORDER — SUCRALFATE 1 G PO TABS
1.0000 g | ORAL_TABLET | Freq: Three times a day (TID) | ORAL | 0 refills | Status: DC
Start: 2021-05-27 — End: 2021-06-09
  Filled 2021-05-27: qty 40, 10d supply, fill #0

## 2021-05-29 MED FILL — Pantoprazole Sodium EC Tab 40 MG (Base Equiv): ORAL | 90 days supply | Qty: 90 | Fill #1 | Status: AC

## 2021-05-30 ENCOUNTER — Other Ambulatory Visit (HOSPITAL_COMMUNITY): Payer: Self-pay

## 2021-06-02 ENCOUNTER — Other Ambulatory Visit: Payer: Self-pay | Admitting: Family Medicine

## 2021-06-02 DIAGNOSIS — Z1231 Encounter for screening mammogram for malignant neoplasm of breast: Secondary | ICD-10-CM

## 2021-06-03 ENCOUNTER — Other Ambulatory Visit (HOSPITAL_COMMUNITY): Payer: Self-pay

## 2021-06-09 ENCOUNTER — Other Ambulatory Visit: Payer: Self-pay | Admitting: Registered Nurse

## 2021-06-09 ENCOUNTER — Encounter: Payer: Self-pay | Admitting: Registered Nurse

## 2021-06-09 ENCOUNTER — Other Ambulatory Visit: Payer: Self-pay | Admitting: Family Medicine

## 2021-06-09 ENCOUNTER — Other Ambulatory Visit (HOSPITAL_COMMUNITY): Payer: Self-pay

## 2021-06-09 DIAGNOSIS — R0989 Other specified symptoms and signs involving the circulatory and respiratory systems: Secondary | ICD-10-CM

## 2021-06-09 DIAGNOSIS — K219 Gastro-esophageal reflux disease without esophagitis: Secondary | ICD-10-CM

## 2021-06-09 MED ORDER — SUCRALFATE 1 G PO TABS
1.0000 g | ORAL_TABLET | Freq: Three times a day (TID) | ORAL | 0 refills | Status: DC
Start: 2021-06-09 — End: 2021-07-04
  Filled 2021-06-09: qty 120, 30d supply, fill #0

## 2021-06-09 MED ORDER — METOPROLOL SUCCINATE ER 50 MG PO TB24
ORAL_TABLET | Freq: Every day | ORAL | 2 refills | Status: DC
  Filled 2021-06-09: qty 90, 90d supply, fill #0
  Filled 2021-08-22: qty 90, 90d supply, fill #1
  Filled 2021-11-20: qty 90, 90d supply, fill #2

## 2021-06-09 NOTE — Telephone Encounter (Signed)
Both of the patient's medication refills have been sent to the pharmacy.

## 2021-06-30 ENCOUNTER — Ambulatory Visit
Admission: RE | Admit: 2021-06-30 | Discharge: 2021-06-30 | Disposition: A | Discharge status: Home / Self Care | Payer: 59 | Source: Ambulatory Visit | Attending: Family Medicine | Admitting: Family Medicine

## 2021-06-30 DIAGNOSIS — Z1231 Encounter for screening mammogram for malignant neoplasm of breast: Secondary | ICD-10-CM

## 2021-07-01 ENCOUNTER — Telehealth: Payer: Self-pay

## 2021-07-01 NOTE — Telephone Encounter (Signed)
Caller name:Sophia Whedbee   On DPR? :Yes  Call back [place]  Provider they see: Birdie Riddle   Reason for call:[facility] sent a form over to be signed by Dr Birdie Riddle. This form is placed in [facility]

## 2021-07-01 NOTE — Telephone Encounter (Signed)
Form in bin up front

## 2021-07-04 ENCOUNTER — Other Ambulatory Visit: Payer: Self-pay | Admitting: Registered Nurse

## 2021-07-04 ENCOUNTER — Other Ambulatory Visit (HOSPITAL_COMMUNITY): Payer: Self-pay

## 2021-07-04 DIAGNOSIS — K219 Gastro-esophageal reflux disease without esophagitis: Secondary | ICD-10-CM

## 2021-07-04 MED ORDER — SUCRALFATE 1 G PO TABS
1.0000 g | ORAL_TABLET | Freq: Three times a day (TID) | ORAL | 0 refills | Status: DC
Start: 2021-07-04 — End: 2021-08-01
  Filled 2021-07-04: qty 120, 30d supply, fill #0

## 2021-07-04 NOTE — Telephone Encounter (Signed)
Form completed and placed in basket  

## 2021-07-05 ENCOUNTER — Other Ambulatory Visit: Payer: Self-pay | Admitting: Family Medicine

## 2021-07-05 DIAGNOSIS — N644 Mastodynia: Secondary | ICD-10-CM

## 2021-07-05 DIAGNOSIS — F419 Anxiety disorder, unspecified: Secondary | ICD-10-CM | POA: Diagnosis not present

## 2021-07-12 DIAGNOSIS — F4323 Adjustment disorder with mixed anxiety and depressed mood: Secondary | ICD-10-CM | POA: Diagnosis not present

## 2021-07-19 ENCOUNTER — Other Ambulatory Visit: Payer: Self-pay | Admitting: Family Medicine

## 2021-07-19 DIAGNOSIS — Z1231 Encounter for screening mammogram for malignant neoplasm of breast: Secondary | ICD-10-CM

## 2021-07-20 DIAGNOSIS — F419 Anxiety disorder, unspecified: Secondary | ICD-10-CM | POA: Diagnosis not present

## 2021-07-28 ENCOUNTER — Ambulatory Visit
Admission: RE | Admit: 2021-07-28 | Discharge: 2021-07-28 | Disposition: A | Discharge status: Home / Self Care | Payer: 59 | Source: Ambulatory Visit | Attending: Family Medicine | Admitting: Family Medicine

## 2021-07-28 ENCOUNTER — Other Ambulatory Visit: Payer: Self-pay

## 2021-07-28 DIAGNOSIS — Z1231 Encounter for screening mammogram for malignant neoplasm of breast: Secondary | ICD-10-CM

## 2021-07-29 DIAGNOSIS — H5211 Myopia, right eye: Secondary | ICD-10-CM | POA: Diagnosis not present

## 2021-08-01 ENCOUNTER — Other Ambulatory Visit: Payer: Self-pay | Admitting: Registered Nurse

## 2021-08-01 DIAGNOSIS — K219 Gastro-esophageal reflux disease without esophagitis: Secondary | ICD-10-CM

## 2021-08-02 ENCOUNTER — Other Ambulatory Visit (HOSPITAL_COMMUNITY): Payer: Self-pay

## 2021-08-02 MED ORDER — SUCRALFATE 1 G PO TABS
1.0000 g | ORAL_TABLET | Freq: Three times a day (TID) | ORAL | 0 refills | Status: DC
Start: 2021-08-02 — End: 2021-08-22
  Filled 2021-08-02: qty 120, 30d supply, fill #0

## 2021-08-09 DIAGNOSIS — F419 Anxiety disorder, unspecified: Secondary | ICD-10-CM | POA: Diagnosis not present

## 2021-08-22 ENCOUNTER — Other Ambulatory Visit: Payer: Self-pay | Admitting: Family Medicine

## 2021-08-22 ENCOUNTER — Other Ambulatory Visit (HOSPITAL_COMMUNITY): Payer: Self-pay

## 2021-08-22 ENCOUNTER — Other Ambulatory Visit: Payer: Self-pay | Admitting: Registered Nurse

## 2021-08-22 DIAGNOSIS — K219 Gastro-esophageal reflux disease without esophagitis: Secondary | ICD-10-CM

## 2021-08-22 DIAGNOSIS — F411 Generalized anxiety disorder: Secondary | ICD-10-CM

## 2021-08-22 MED ORDER — SUCRALFATE 1 G PO TABS
1.0000 g | ORAL_TABLET | Freq: Three times a day (TID) | ORAL | 0 refills | Status: DC
  Filled 2021-08-22: qty 120, 30d supply, fill #0

## 2021-08-23 ENCOUNTER — Other Ambulatory Visit (HOSPITAL_COMMUNITY): Payer: Self-pay

## 2021-08-23 DIAGNOSIS — F419 Anxiety disorder, unspecified: Secondary | ICD-10-CM | POA: Diagnosis not present

## 2021-08-23 MED ORDER — PANTOPRAZOLE SODIUM 40 MG PO TBEC
DELAYED_RELEASE_TABLET | Freq: Every day | ORAL | 1 refills | Status: DC
  Filled 2021-08-23: qty 90, 90d supply, fill #0

## 2021-08-24 ENCOUNTER — Other Ambulatory Visit (HOSPITAL_COMMUNITY): Payer: Self-pay

## 2021-08-24 MED ORDER — ALPRAZOLAM 0.5 MG PO TABS
ORAL_TABLET | Freq: Two times a day (BID) | ORAL | 0 refills | Status: DC | PRN
  Filled 2021-08-24: qty 30, 15d supply, fill #0

## 2021-08-24 MED ORDER — ESTRADIOL 0.5 MG PO TABS
ORAL_TABLET | Freq: Every evening | ORAL | 1 refills | Status: DC
  Filled 2021-08-24: qty 90, 90d supply, fill #0
  Filled 2021-12-16: qty 90, 90d supply, fill #1

## 2021-08-25 ENCOUNTER — Other Ambulatory Visit (HOSPITAL_COMMUNITY): Payer: Self-pay

## 2021-08-26 ENCOUNTER — Other Ambulatory Visit (HOSPITAL_COMMUNITY): Payer: Self-pay

## 2021-09-13 DIAGNOSIS — F419 Anxiety disorder, unspecified: Secondary | ICD-10-CM | POA: Diagnosis not present

## 2021-09-28 DIAGNOSIS — F419 Anxiety disorder, unspecified: Secondary | ICD-10-CM | POA: Diagnosis not present

## 2021-10-04 ENCOUNTER — Other Ambulatory Visit (HOSPITAL_COMMUNITY): Payer: Self-pay

## 2021-10-04 ENCOUNTER — Other Ambulatory Visit: Payer: Self-pay | Admitting: Registered Nurse

## 2021-10-04 DIAGNOSIS — K219 Gastro-esophageal reflux disease without esophagitis: Secondary | ICD-10-CM

## 2021-10-04 MED ORDER — SUCRALFATE 1 G PO TABS
1.0000 g | ORAL_TABLET | Freq: Three times a day (TID) | ORAL | 0 refills | Status: DC
  Filled 2021-10-04: qty 120, 30d supply, fill #0

## 2021-10-05 ENCOUNTER — Other Ambulatory Visit (HOSPITAL_COMMUNITY): Payer: Self-pay

## 2021-10-12 DIAGNOSIS — F419 Anxiety disorder, unspecified: Secondary | ICD-10-CM | POA: Diagnosis not present

## 2021-10-18 ENCOUNTER — Telehealth (INDEPENDENT_AMBULATORY_CARE_PROVIDER_SITE_OTHER): Payer: 59 | Admitting: Family Medicine

## 2021-10-18 ENCOUNTER — Encounter: Payer: Self-pay | Admitting: Family Medicine

## 2021-10-18 ENCOUNTER — Other Ambulatory Visit (INDEPENDENT_AMBULATORY_CARE_PROVIDER_SITE_OTHER): Payer: 59

## 2021-10-18 ENCOUNTER — Other Ambulatory Visit (HOSPITAL_COMMUNITY): Payer: Self-pay

## 2021-10-18 DIAGNOSIS — K219 Gastro-esophageal reflux disease without esophagitis: Secondary | ICD-10-CM | POA: Diagnosis not present

## 2021-10-18 DIAGNOSIS — R0982 Postnasal drip: Secondary | ICD-10-CM

## 2021-10-18 DIAGNOSIS — Z79899 Other long term (current) drug therapy: Secondary | ICD-10-CM | POA: Diagnosis not present

## 2021-10-18 LAB — B12 AND FOLATE PANEL
Folate: 12.4 ng/mL (ref >5.9)
Vitamin B-12: 290 pg/mL (ref 211–911)

## 2021-10-18 LAB — BASIC METABOLIC PANEL
BUN: 11 mg/dL (ref 6–23)
CO2: 26 mEq/L (ref 19–32)
Calcium: 9.1 mg/dL (ref 8.4–10.5)
Chloride: 103 mEq/L (ref 96–112)
Creatinine, Ser: 0.8 mg/dL (ref 0.40–1.20)
GFR: 86.19 mL/min (ref >60.00)
Glucose, Bld: 91 mg/dL (ref 70–99)
Potassium: 3.8 mEq/L (ref 3.5–5.1)
Sodium: 136 mEq/L (ref 135–145)

## 2021-10-18 LAB — VITAMIN D 25 HYDROXY (VIT D DEFICIENCY, FRACTURES): VITD: 26.55 ng/mL — ABNORMAL LOW (ref 30.00–100.00)

## 2021-10-18 LAB — H. PYLORI ANTIBODY, IGG: H Pylori IgG: NEGATIVE

## 2021-10-18 MED ORDER — PANTOPRAZOLE SODIUM 40 MG PO TBEC
40.0000 mg | DELAYED_RELEASE_TABLET | Freq: Two times a day (BID) | ORAL | 1 refills | Status: DC
  Filled 2021-10-18 (×2): qty 180, 90d supply, fill #0
  Filled 2022-02-05: qty 180, 90d supply, fill #1

## 2021-10-18 NOTE — Progress Notes (Signed)
? ?Virtual Visit via Video  ? ?I connected with patient on 10/18/21 at 10:20 AM EDT by a video enabled telemedicine application and verified that I am speaking with the correct person using two identifiers. ? ?Location patient: Home ?Location provider: [facility], Office ?Persons participating in the virtual visit: Patient, Provider, [place] [employer]orne Billings C) ? ?I discussed the limitations of evaluation and management by telemedicine and the availability of in person appointments. The patient expressed understanding and agreed to proceed. ? ?Subjective:  ? ?HPI:  ? ?Cough- pt reports ongoing cough and GERD despite medications.  Pt reports that initially Flonase was very helpful but then it seemed to stop working.  Then GERD worsened.  Was started on Carafate 3x/day 'which really helps' but sxs 'breakthrough'.  Has near continuous clearing of throat, cough- worse w/ prolonged coughing.  Pt is worried about chronic inflammation.  Hx of remote H pylori infxn.  Pt is worried about Ca, B12, and Vit D levels w/ prolonged use of PPI. ? ?ROS:  ? ?See pertinent positives and negatives per HPI. ? ?Patient Active Problem List  ? Diagnosis Date Noted  ? Chronic neck and back pain 09/24/2020  ? PND (post-nasal drip) 09/24/2020  ? Visit for preventive health examination 01/14/2019  ? Benign cystic nephroma 08/08/2017  ? GERD (gastroesophageal reflux disease) 08/08/2017  ? Labile blood pressure 08/08/2017  ? Physical exam 10/27/2016  ? History of renal hypertension 12/20/2015  ? Anxiety state 12/20/2015  ? Obesity (BMI 35.0-39.9 without comorbidity) ([place]) 12/02/2013  ?  ?Social History  ? ?Tobacco Use  ? Smoking status: Never  ? Smokeless tobacco: Never  ?Substance Use Topics  ? Alcohol use: Yes  ?  Alcohol/week: 2.0 - 3.0 standard drinks  ?  Types: 2 - 3 Standard drinks or equivalent per week  ? ? ?Current Outpatient Medications:  ?  acetaminophen (TYLENOL) 500 MG tablet, Take 1,000 mg by mouth once as needed for mild pain or  headache., Disp: , Rfl:  ?  ALPRAZolam (XANAX) 0.5 MG tablet, Take 1 tablet by mouth 2 times daily as needed for anxiety., Disp: 30 tablet, Rfl: 0 ?  COVID-19 At Home Antigen Test (CARESTART COVID-19 HOME TEST) KIT, USE AS DIRECTED WITHIN PACKAGE INSTRUCTIONS, Disp: 2 each, Rfl: 0 ?  cyclobenzaprine (FLEXERIL) 5 MG tablet, TAKE 1 TABLET BY MOUTH 3 TIMES DAILY AS NEEDED FOR MUSCLE SPASMS., Disp: 30 tablet, Rfl: 1 ?  cyclobenzaprine (FLEXERIL) 5 MG tablet, Take 1 tablet by mouth three times daily as needed for muscle spasms, Disp: 30 tablet, Rfl: 1 ?  docusate sodium (COLACE) 100 MG capsule, Take 1 capsule (100 mg total) by mouth 2 (two) times daily., Disp: 40 capsule, Rfl: 0 ?  estradiol (ESTRACE) 0.5 MG tablet, Take 1 tablet by mouth every evening., Disp: 90 tablet, Rfl: 1 ?  fexofenadine (ALLEGRA ALLERGY) 180 MG tablet, Take 1 tablet (180 mg total) by mouth daily., Disp: 90 tablet, Rfl: 1 ?  fluticasone (FLONASE) 50 MCG/ACT nasal spray, Place 2 sprays into both nostrils daily., Disp: 16 g, Rfl: 6 ?  Licorice, Glycyrrhiza glabra, (LICORICE PO), Take by mouth., Disp: , Rfl:  ?  loratadine (CLARITIN) 10 MG tablet, Take 10 mg by mouth daily., Disp: , Rfl:  ?  metoprolol succinate (TOPROL-XL) 50 MG 24 hr tablet, TAKE 1 TABLET BY MOUTH ONCE DAILY, Disp: 90 tablet, Rfl: 2 ?  Multiple Vitamin (MULTIVITAMIN) tablet, Take 1 tablet by mouth daily., Disp: , Rfl:  ?  nystatin (MYCOSTATIN/NYSTOP) powder, Apply  1 application topically 3 (three) times daily., Disp: 15 g, Rfl: 0 ?  ondansetron (ZOFRAN) 4 MG tablet, Take 1 tablet (4 mg total) by mouth every 8 (eight) hours as needed for nausea or vomiting., Disp: 20 tablet, Rfl: 0 ?  pantoprazole (PROTONIX) 40 MG tablet, TAKE 1 TABLET BY MOUTH ONCE DAILY, Disp: 90 tablet, Rfl: 1 ?  Probiotic Product (PROBIOTIC PO), Take by mouth., Disp: , Rfl:  ?  sucralfate (CARAFATE) 1 g tablet, Take 1 tablet by mouth 4 times daily with meals and at bedtime., Disp: 120 tablet, Rfl: 0 ?  traMADol  (ULTRAM) 50 MG tablet, TAKE 1 TABLET BY MOUTH TWO TIMES DAILY AS NEEDED, Disp: 30 tablet, Rfl: 0 ?  azelastine (ASTELIN) 0.1 % nasal spray, Place 1 spray into both nostrils 2 (two) times daily. (Patient not taking: Reported on 10/18/2021), Disp: 30 mL, Rfl: 12 ?  PFIZER-BIONTECH COVID-19 VACC 30 MCG/0.3ML injection, , Disp: , Rfl:  ? ?Allergies  ?Allergen Reactions  ? Neosporin [Neomycin-Bacitracin Zn-Polymyx] Rash  ? Sulfa Antibiotics Rash  ?  rash  ? ? ?Objective:  ? ?There were no vitals taken for this visit. ?AAOx3, NAD ?NCAT, EOMI ?No obvious CN deficits ?Coloring WNL ?Pt is able to speak clearly, coherently without shortness of breath or increased work of breathing.  ?Near constant throat clearing ?Thought process is linear.  Mood is appropriate.  ? ?Assessment and Plan:  ? ?GERD- ongoing issue despite daily PPI and Carafate.  Will increase PPI to BID dosing, continue Carafate, check for H Pylori (pt has hx of prior infxn), and refer to GI.  Pt wants to research providers before I place referral.  She will message me when she decides who she wants to see. ? ?PND- ongoing.  Astelin made sxs worse.  Continue Flonase and daily antihistamine.  Pt expressed understanding and is in agreement w/ plan.  ? ?Long term use of PPI- given prolonged use of PPI will check Ca, Vit D, B12 levels and supplement as needed ? ? ?Annye Asa, MD ?10/18/2021 ? ? ?

## 2021-11-04 ENCOUNTER — Other Ambulatory Visit (HOSPITAL_COMMUNITY): Payer: Self-pay

## 2021-11-04 ENCOUNTER — Encounter: Payer: Self-pay | Admitting: Family Medicine

## 2021-11-04 MED ORDER — VITAMIN D (ERGOCALCIFEROL) 1.25 MG (50000 UNIT) PO CAPS
50000.0000 [IU] | ORAL_CAPSULE | ORAL | 0 refills | Status: DC
  Filled 2021-11-04: qty 12, 84d supply, fill #0

## 2021-11-09 DIAGNOSIS — F419 Anxiety disorder, unspecified: Secondary | ICD-10-CM | POA: Diagnosis not present

## 2021-11-20 ENCOUNTER — Other Ambulatory Visit: Payer: Self-pay | Admitting: Registered Nurse

## 2021-11-20 ENCOUNTER — Other Ambulatory Visit: Payer: Self-pay | Admitting: Family Medicine

## 2021-11-20 DIAGNOSIS — F411 Generalized anxiety disorder: Secondary | ICD-10-CM

## 2021-11-20 DIAGNOSIS — K219 Gastro-esophageal reflux disease without esophagitis: Secondary | ICD-10-CM

## 2021-11-22 ENCOUNTER — Other Ambulatory Visit (HOSPITAL_COMMUNITY): Payer: Self-pay

## 2021-11-22 MED ORDER — SUCRALFATE 1 G PO TABS
1.0000 g | ORAL_TABLET | Freq: Three times a day (TID) | ORAL | 0 refills | Status: DC
  Filled 2021-11-22: qty 120, 30d supply, fill #0

## 2021-11-22 NOTE — Telephone Encounter (Signed)
Patient is requesting a refill of the following medications: Requested Prescriptions   Pending Prescriptions Disp Refills   ALPRAZolam (XANAX) 0.5 MG tablet 30 tablet 0    Sig: Take 1 tablet by mouth 2 times daily as needed for anxiety.    Date of patient request: 11/22/21 Last office visit: 04/20/21 Date of last refill: 02/20/22 Last refill amount: 30

## 2021-11-23 ENCOUNTER — Other Ambulatory Visit (HOSPITAL_COMMUNITY): Payer: Self-pay

## 2021-11-23 MED ORDER — ALPRAZOLAM 0.5 MG PO TABS
ORAL_TABLET | Freq: Two times a day (BID) | ORAL | 0 refills | Status: DC | PRN
  Filled 2021-11-23: qty 30, 15d supply, fill #0

## 2021-12-06 DIAGNOSIS — F419 Anxiety disorder, unspecified: Secondary | ICD-10-CM | POA: Diagnosis not present

## 2021-12-16 ENCOUNTER — Other Ambulatory Visit (HOSPITAL_COMMUNITY): Payer: Self-pay

## 2021-12-16 ENCOUNTER — Other Ambulatory Visit: Payer: Self-pay | Admitting: Family Medicine

## 2021-12-16 ENCOUNTER — Other Ambulatory Visit: Payer: Self-pay | Admitting: Registered Nurse

## 2021-12-16 DIAGNOSIS — K219 Gastro-esophageal reflux disease without esophagitis: Secondary | ICD-10-CM

## 2021-12-16 DIAGNOSIS — F411 Generalized anxiety disorder: Secondary | ICD-10-CM

## 2021-12-16 MED ORDER — ALPRAZOLAM 0.5 MG PO TABS
ORAL_TABLET | Freq: Two times a day (BID) | ORAL | 3 refills | Status: AC | PRN
  Filled 2021-12-16: qty 30, 15d supply, fill #0
  Filled 2022-01-02: qty 30, 15d supply, fill #1
  Filled 2022-04-14: qty 30, 15d supply, fill #2

## 2021-12-20 DIAGNOSIS — F419 Anxiety disorder, unspecified: Secondary | ICD-10-CM | POA: Diagnosis not present

## 2022-01-02 ENCOUNTER — Other Ambulatory Visit: Payer: Self-pay | Admitting: Registered Nurse

## 2022-01-02 ENCOUNTER — Encounter: Payer: Self-pay | Admitting: Family Medicine

## 2022-01-02 ENCOUNTER — Other Ambulatory Visit (HOSPITAL_COMMUNITY): Payer: Self-pay

## 2022-01-02 DIAGNOSIS — K219 Gastro-esophageal reflux disease without esophagitis: Secondary | ICD-10-CM

## 2022-01-03 ENCOUNTER — Other Ambulatory Visit: Payer: Self-pay | Admitting: Family

## 2022-01-03 ENCOUNTER — Other Ambulatory Visit (HOSPITAL_COMMUNITY): Payer: Self-pay

## 2022-01-03 MED ORDER — TRAMADOL HCL 50 MG PO TABS
50.0000 mg | ORAL_TABLET | Freq: Three times a day (TID) | ORAL | 0 refills | Status: AC | PRN
  Filled 2022-01-03: qty 30, 10d supply, fill #0

## 2022-01-04 ENCOUNTER — Other Ambulatory Visit (HOSPITAL_COMMUNITY): Payer: Self-pay

## 2022-01-05 ENCOUNTER — Other Ambulatory Visit (HOSPITAL_COMMUNITY): Payer: Self-pay

## 2022-01-05 ENCOUNTER — Encounter: Payer: Self-pay | Admitting: Family Medicine

## 2022-01-05 ENCOUNTER — Other Ambulatory Visit: Payer: Self-pay | Admitting: Family Medicine

## 2022-01-05 DIAGNOSIS — K219 Gastro-esophageal reflux disease without esophagitis: Secondary | ICD-10-CM

## 2022-01-05 MED ORDER — SUCRALFATE 1 G PO TABS
1.0000 g | ORAL_TABLET | Freq: Three times a day (TID) | ORAL | 0 refills | Status: DC
  Filled 2022-01-05: qty 120, 30d supply, fill #0

## 2022-01-17 DIAGNOSIS — F419 Anxiety disorder, unspecified: Secondary | ICD-10-CM | POA: Diagnosis not present

## 2022-01-24 ENCOUNTER — Other Ambulatory Visit (HOSPITAL_COMMUNITY): Payer: Self-pay

## 2022-01-24 DIAGNOSIS — Z1211 Encounter for screening for malignant neoplasm of colon: Secondary | ICD-10-CM | POA: Diagnosis not present

## 2022-01-24 DIAGNOSIS — I10 Essential (primary) hypertension: Secondary | ICD-10-CM | POA: Diagnosis not present

## 2022-01-24 DIAGNOSIS — K219 Gastro-esophageal reflux disease without esophagitis: Secondary | ICD-10-CM | POA: Diagnosis not present

## 2022-01-24 DIAGNOSIS — R635 Abnormal weight gain: Secondary | ICD-10-CM | POA: Diagnosis not present

## 2022-01-24 MED ORDER — DEXLANSOPRAZOLE 60 MG PO CPDR
60.0000 mg | DELAYED_RELEASE_CAPSULE | Freq: Every day | ORAL | 4 refills | Status: DC
  Filled 2022-01-24: qty 90, 90d supply, fill #0

## 2022-01-24 MED ORDER — CLENPIQ 10-3.5-12 MG-GM -GM/175ML PO SOLN
ORAL | 0 refills | Status: DC
  Filled 2022-01-24: qty 350, 1d supply, fill #0

## 2022-01-25 ENCOUNTER — Other Ambulatory Visit (HOSPITAL_COMMUNITY): Payer: Self-pay

## 2022-02-05 ENCOUNTER — Other Ambulatory Visit: Payer: Self-pay | Admitting: Family Medicine

## 2022-02-05 DIAGNOSIS — K219 Gastro-esophageal reflux disease without esophagitis: Secondary | ICD-10-CM

## 2022-02-06 ENCOUNTER — Other Ambulatory Visit (HOSPITAL_COMMUNITY): Payer: Self-pay

## 2022-02-06 MED ORDER — ESTRADIOL 0.5 MG PO TABS
0.5000 mg | ORAL_TABLET | Freq: Every evening | ORAL | 1 refills | Status: DC
  Filled 2022-02-06: qty 90, fill #0
  Filled 2022-04-14: qty 90, 90d supply, fill #0
  Filled 2022-08-25: qty 90, 90d supply, fill #1

## 2022-02-06 MED ORDER — SUCRALFATE 1 G PO TABS
1.0000 g | ORAL_TABLET | Freq: Three times a day (TID) | ORAL | 0 refills | Status: DC
  Filled 2022-02-06: qty 120, 30d supply, fill #0

## 2022-02-07 DIAGNOSIS — F419 Anxiety disorder, unspecified: Secondary | ICD-10-CM | POA: Diagnosis not present

## 2022-02-15 LAB — HM COLONOSCOPY

## 2022-02-22 DIAGNOSIS — K317 Polyp of stomach and duodenum: Secondary | ICD-10-CM | POA: Diagnosis not present

## 2022-02-22 DIAGNOSIS — D122 Benign neoplasm of ascending colon: Secondary | ICD-10-CM | POA: Diagnosis not present

## 2022-02-22 DIAGNOSIS — Z1211 Encounter for screening for malignant neoplasm of colon: Secondary | ICD-10-CM | POA: Diagnosis not present

## 2022-02-22 DIAGNOSIS — K621 Rectal polyp: Secondary | ICD-10-CM | POA: Diagnosis not present

## 2022-02-22 DIAGNOSIS — K635 Polyp of colon: Secondary | ICD-10-CM | POA: Diagnosis not present

## 2022-02-22 DIAGNOSIS — D128 Benign neoplasm of rectum: Secondary | ICD-10-CM | POA: Diagnosis not present

## 2022-02-22 DIAGNOSIS — K3189 Other diseases of stomach and duodenum: Secondary | ICD-10-CM | POA: Diagnosis not present

## 2022-02-22 DIAGNOSIS — K219 Gastro-esophageal reflux disease without esophagitis: Secondary | ICD-10-CM | POA: Diagnosis not present

## 2022-02-22 DIAGNOSIS — K2950 Unspecified chronic gastritis without bleeding: Secondary | ICD-10-CM | POA: Diagnosis not present

## 2022-02-26 ENCOUNTER — Other Ambulatory Visit: Payer: Self-pay | Admitting: Family Medicine

## 2022-02-26 ENCOUNTER — Other Ambulatory Visit (HOSPITAL_COMMUNITY): Payer: Self-pay

## 2022-02-26 DIAGNOSIS — R0982 Postnasal drip: Secondary | ICD-10-CM

## 2022-02-26 DIAGNOSIS — R0989 Other specified symptoms and signs involving the circulatory and respiratory systems: Secondary | ICD-10-CM

## 2022-02-28 ENCOUNTER — Other Ambulatory Visit (HOSPITAL_COMMUNITY): Payer: Self-pay

## 2022-02-28 MED ORDER — FLUTICASONE PROPIONATE 50 MCG/ACT NA SUSP
2.0000 | Freq: Every day | NASAL | 6 refills | Status: DC
  Filled 2022-02-28: qty 16, 30d supply, fill #0
  Filled 2022-04-14: qty 16, 30d supply, fill #1
  Filled 2022-05-30: qty 16, 30d supply, fill #2

## 2022-02-28 MED ORDER — METOPROLOL SUCCINATE ER 50 MG PO TB24
ORAL_TABLET | Freq: Every day | ORAL | 2 refills | Status: DC
  Filled 2022-02-28: qty 90, 90d supply, fill #0
  Filled 2022-05-30: qty 90, 90d supply, fill #1
  Filled 2022-08-25: qty 90, 90d supply, fill #2

## 2022-03-01 DIAGNOSIS — F419 Anxiety disorder, unspecified: Secondary | ICD-10-CM | POA: Diagnosis not present

## 2022-03-08 ENCOUNTER — Other Ambulatory Visit (HOSPITAL_COMMUNITY): Payer: Self-pay

## 2022-03-21 DIAGNOSIS — F419 Anxiety disorder, unspecified: Secondary | ICD-10-CM | POA: Diagnosis not present

## 2022-04-03 ENCOUNTER — Other Ambulatory Visit (HOSPITAL_COMMUNITY): Payer: Self-pay

## 2022-04-03 ENCOUNTER — Encounter: Payer: Self-pay | Admitting: Family Medicine

## 2022-04-03 ENCOUNTER — Ambulatory Visit: Payer: 59 | Admitting: Family Medicine

## 2022-04-03 VITALS — BP 120/88 | HR 67 | Temp 97.5°F | Resp 16 | Ht 66.0 in | Wt 232.5 lb

## 2022-04-03 DIAGNOSIS — J020 Streptococcal pharyngitis: Secondary | ICD-10-CM

## 2022-04-03 DIAGNOSIS — J029 Acute pharyngitis, unspecified: Secondary | ICD-10-CM | POA: Diagnosis not present

## 2022-04-03 DIAGNOSIS — G510 Bell's palsy: Secondary | ICD-10-CM

## 2022-04-03 LAB — POCT RAPID STREP A (OFFICE): Rapid Strep A Screen: POSITIVE — AB

## 2022-04-03 MED ORDER — VALACYCLOVIR HCL 1 G PO TABS
1000.0000 mg | ORAL_TABLET | Freq: Three times a day (TID) | ORAL | 0 refills | Status: AC
  Filled 2022-04-03: qty 21, 7d supply, fill #0

## 2022-04-03 MED ORDER — PREDNISONE 10 MG PO TABS
ORAL_TABLET | ORAL | 0 refills | Status: DC
  Filled 2022-04-03: qty 18, 9d supply, fill #0

## 2022-04-03 MED ORDER — AMOXICILLIN 875 MG PO TABS
875.0000 mg | ORAL_TABLET | Freq: Two times a day (BID) | ORAL | 0 refills | Status: AC
  Filled 2022-04-03: qty 20, 10d supply, fill #0

## 2022-04-03 NOTE — Patient Instructions (Addendum)
Follow up as needed or as scheduled START the Amoxicillin twice daily- take w/ food- for the strep throat TAKE the Prednisone as directed- w/ food- for the Bells Palsy TAKE the Valtrex 3x/day x7 days USE lubricating eye drops if needed Call with any questions or concerns Stay Safe!  Stay Healthy! Hang in there!!!

## 2022-04-03 NOTE — Progress Notes (Signed)
   Subjective:    Patient ID: Jackie Mcconnell, female    DOB: February 01, 1971, 51 y.o.   MRN: [id number]  HPI Sore throat- 6 days ago pt developed sore throat.  Denies fevers/chills.  Pt reports 'HA around my ears- like when your glasses are too tight'.  Reports taste was altered- 'like when novocain is wearing off'.  As of Saturday pt reports smile is asymmetric.  Can squeeze L eye tight but not right.   Review of Systems For ROS see HPI     Objective:   Physical Exam Vitals reviewed.  Constitutional:      General: She is not in acute distress.    Appearance: She is well-developed. She is not ill-appearing.  HENT:     Head: Normocephalic and atraumatic.     Nose:     Comments: Loss of R nasolabial fold    Mouth/Throat:     Mouth: Mucous membranes are moist.     Pharynx: Posterior oropharyngeal erythema present. No pharyngeal swelling or oropharyngeal exudate.  Eyes:     Comments: Inability to fully close R eye  Musculoskeletal:     Cervical back: Normal range of motion and neck supple.  Neurological:     Mental Status: She is alert.     Comments: Asymmetric smile, inability to fully close R eye, loss of R nasolabial fold           Assessment & Plan:   Strep throat- new.  Pt w/ 6 days of sore throat that has been worsening.  Rapid test in office + almost immediately.  Start tx w/ Amoxicillin.  Pt expressed understanding and is in agreement w/ plan.   Bells Palsy- new.  Pt w/ textbook sxs- inability to fully close R eye, altered taste, asymmetric smile, loss of nasolabial fold.  Suspect this was triggered by strep infxn.  Will treat strep as above but will also treat the Bells Palsy w/ Prednisone taper and Valtrex '1000mg'$  TID x7 days.  Reviewed supportive care and red flags that should prompt return.  Pt expressed understanding and is in agreement w/ plan.

## 2022-04-11 DIAGNOSIS — F419 Anxiety disorder, unspecified: Secondary | ICD-10-CM | POA: Diagnosis not present

## 2022-04-14 ENCOUNTER — Other Ambulatory Visit (HOSPITAL_COMMUNITY): Payer: Self-pay

## 2022-04-14 ENCOUNTER — Other Ambulatory Visit: Payer: Self-pay | Admitting: Family Medicine

## 2022-04-14 ENCOUNTER — Encounter: Payer: Self-pay | Admitting: Family Medicine

## 2022-04-14 DIAGNOSIS — K219 Gastro-esophageal reflux disease without esophagitis: Secondary | ICD-10-CM

## 2022-04-14 MED ORDER — SUCRALFATE 1 G PO TABS
1.0000 g | ORAL_TABLET | Freq: Three times a day (TID) | ORAL | 0 refills | Status: DC
  Filled 2022-04-14: qty 120, 30d supply, fill #0

## 2022-05-02 ENCOUNTER — Encounter: Payer: Self-pay | Admitting: Family Medicine

## 2022-05-02 DIAGNOSIS — F419 Anxiety disorder, unspecified: Secondary | ICD-10-CM | POA: Diagnosis not present

## 2022-05-10 ENCOUNTER — Other Ambulatory Visit (HOSPITAL_COMMUNITY): Payer: Self-pay

## 2022-05-10 ENCOUNTER — Encounter: Payer: Self-pay | Admitting: Family Medicine

## 2022-05-10 ENCOUNTER — Ambulatory Visit: Payer: 59 | Admitting: Family Medicine

## 2022-05-10 VITALS — BP 118/80 | HR 96 | Temp 98.8°F | Resp 18 | Ht 66.0 in | Wt 227.1 lb

## 2022-05-10 DIAGNOSIS — U071 COVID-19: Secondary | ICD-10-CM | POA: Diagnosis not present

## 2022-05-10 DIAGNOSIS — R112 Nausea with vomiting, unspecified: Secondary | ICD-10-CM | POA: Diagnosis not present

## 2022-05-10 DIAGNOSIS — R197 Diarrhea, unspecified: Secondary | ICD-10-CM

## 2022-05-10 DIAGNOSIS — J029 Acute pharyngitis, unspecified: Secondary | ICD-10-CM

## 2022-05-10 LAB — POC COVID19 BINAXNOW: SARS Coronavirus 2 Ag: POSITIVE — AB

## 2022-05-10 LAB — POCT RAPID STREP A (OFFICE): Rapid Strep A Screen: NEGATIVE

## 2022-05-10 MED ORDER — MOLNUPIRAVIR EUA 200MG CAPSULE
4.0000 | ORAL_CAPSULE | Freq: Two times a day (BID) | ORAL | 0 refills | Status: AC
  Filled 2022-05-10: qty 40, 5d supply, fill #0

## 2022-05-10 MED ORDER — ONDANSETRON HCL 4 MG PO TABS
4.0000 mg | ORAL_TABLET | Freq: Three times a day (TID) | ORAL | 0 refills | Status: DC | PRN
Start: 2022-05-10 — End: 2024-04-04
  Filled 2022-05-10: qty 20, 7d supply, fill #0

## 2022-05-10 NOTE — Progress Notes (Signed)
   Subjective:    Patient ID: Jackie Mcconnell, female    DOB: [birth year], 51 y.o.   MRN: [id number]  HPI Sore throat- pt reports throat hurts similar to previous strep episode.  Yesterday developed N/V/D.  Had fever to 101 last night.  + HA, neck pain.  Denies skin sensitivity.  + fatigue.  Has not tested for COVID.  Denies sinus pain.  No known sick contacts.  No cough.   Review of Systems For ROS see HPI     Objective:   Physical Exam Vitals reviewed.  Constitutional:      General: She is not in acute distress.    Appearance: She is well-developed.  HENT:     Head: Normocephalic and atraumatic.     Right Ear: Tympanic membrane normal.     Left Ear: Tympanic membrane normal.     Nose: Mucosal edema and congestion present. No rhinorrhea.     Right Sinus: No maxillary sinus tenderness or frontal sinus tenderness.     Left Sinus: No maxillary sinus tenderness or frontal sinus tenderness.     Mouth/Throat:     Mouth: Mucous membranes are moist. No oral lesions.     Pharynx: Uvula midline. Posterior oropharyngeal erythema (w/ PND) present. No oropharyngeal exudate.     Tonsils: No tonsillar exudate or tonsillar abscesses.  Eyes:     Conjunctiva/sclera: Conjunctivae normal.     Pupils: Pupils are equal, round, and reactive to light.  Cardiovascular:     Rate and Rhythm: Normal rate and regular rhythm.     Heart sounds: Normal heart sounds.  Pulmonary:     Effort: Pulmonary effort is normal. No respiratory distress.     Breath sounds: Normal breath sounds. No wheezing or rales.  Musculoskeletal:     Cervical back: Normal range of motion and neck supple.  Lymphadenopathy:     Cervical: No cervical adenopathy.  Skin:    General: Skin is warm and dry.  Neurological:     General: No focal deficit present.     Mental Status: She is oriented to person, place, and time.  Psychiatric:        Mood and Affect: Mood normal.        Behavior: Behavior normal.        Thought Content:  Thought content normal.           Assessment & Plan:  COVID- new.  Pt's description of sore throat that was followed by N/V/D and feeling 'hit by a truck' are consistent w/ viral illness.  COVID + in office.  Pt reports already feeling better than yesterday, so not sure if she wants to take antiviral medication.  Will send to pharmacy for her to start if sxs again worsening.  Reviewed supportive care and red flags that should prompt return.  Pt expressed understanding and is in agreement w/ plan.

## 2022-05-10 NOTE — Patient Instructions (Signed)
Follow up as needed or as scheduled Your COVID test is + IF your symptoms return or worsen, start the Molnupiravir as directed Drink LOTS of fluids REST!!! Call with any questions or concerns Hang in there!!

## 2022-05-30 ENCOUNTER — Other Ambulatory Visit (HOSPITAL_COMMUNITY): Payer: Self-pay

## 2022-05-30 ENCOUNTER — Other Ambulatory Visit: Payer: Self-pay | Admitting: Family Medicine

## 2022-05-30 DIAGNOSIS — F419 Anxiety disorder, unspecified: Secondary | ICD-10-CM | POA: Diagnosis not present

## 2022-05-30 DIAGNOSIS — K219 Gastro-esophageal reflux disease without esophagitis: Secondary | ICD-10-CM

## 2022-05-31 ENCOUNTER — Other Ambulatory Visit (HOSPITAL_COMMUNITY): Payer: Self-pay

## 2022-05-31 MED ORDER — PANTOPRAZOLE SODIUM 40 MG PO TBEC
40.0000 mg | DELAYED_RELEASE_TABLET | Freq: Two times a day (BID) | ORAL | 1 refills | Status: DC
  Filled 2022-05-31: qty 180, 90d supply, fill #0
  Filled 2022-08-25: qty 180, 90d supply, fill #1

## 2022-07-11 DIAGNOSIS — F419 Anxiety disorder, unspecified: Secondary | ICD-10-CM | POA: Diagnosis not present

## 2022-08-08 DIAGNOSIS — F419 Anxiety disorder, unspecified: Secondary | ICD-10-CM | POA: Diagnosis not present

## 2022-08-25 ENCOUNTER — Other Ambulatory Visit (HOSPITAL_BASED_OUTPATIENT_CLINIC_OR_DEPARTMENT_OTHER): Payer: Self-pay

## 2022-09-04 ENCOUNTER — Encounter: Payer: Self-pay | Admitting: Family Medicine

## 2022-09-04 ENCOUNTER — Other Ambulatory Visit: Payer: Self-pay

## 2022-09-04 ENCOUNTER — Other Ambulatory Visit (HOSPITAL_COMMUNITY): Payer: Self-pay

## 2022-09-04 ENCOUNTER — Telehealth: Payer: Self-pay

## 2022-09-04 ENCOUNTER — Ambulatory Visit (INDEPENDENT_AMBULATORY_CARE_PROVIDER_SITE_OTHER): Payer: Commercial Managed Care - PPO | Admitting: Family Medicine

## 2022-09-04 VITALS — BP 136/84 | HR 65 | Temp 98.9°F | Resp 17 | Ht 66.0 in | Wt 232.2 lb

## 2022-09-04 DIAGNOSIS — E669 Obesity, unspecified: Secondary | ICD-10-CM

## 2022-09-04 DIAGNOSIS — Z0000 Encounter for general adult medical examination without abnormal findings: Secondary | ICD-10-CM

## 2022-09-04 DIAGNOSIS — R0989 Other specified symptoms and signs involving the circulatory and respiratory systems: Secondary | ICD-10-CM | POA: Diagnosis not present

## 2022-09-04 LAB — POCT URINALYSIS DIPSTICK
Glucose, UA: NEGATIVE
Leukocytes, UA: NEGATIVE
Protein, UA: NEGATIVE
Spec Grav, UA: 1.015 (ref 1.010–1.025)
Urobilinogen, UA: 0.2 E.U./dL
pH, UA: 7 (ref 5.0–8.0)

## 2022-09-04 MED ORDER — TRAMADOL HCL 50 MG PO TABS
50.0000 mg | ORAL_TABLET | Freq: Four times a day (QID) | ORAL | 0 refills | Status: DC | PRN
  Filled 2022-09-04: qty 30, 8d supply, fill #0

## 2022-09-04 MED ORDER — METOPROLOL SUCCINATE ER 100 MG PO TB24
100.0000 mg | ORAL_TABLET | Freq: Every day | ORAL | 1 refills | Status: DC
  Filled 2022-09-04: qty 90, 90d supply, fill #0
  Filled 2023-01-02: qty 90, 90d supply, fill #1

## 2022-09-04 NOTE — Telephone Encounter (Signed)
-----   Message from [name], MD sent at 09/04/2022  4:42 PM EDT ----- No evidence of protein in urine- great news!

## 2022-09-04 NOTE — Assessment & Plan Note (Signed)
Pt's PE WNL w/ exception of BMI.  UTD on colonoscopy.  Due for mammo- pt to schedule- and Tdap (will get at work).  Check labs.  Anticipatory guidance provided.

## 2022-09-04 NOTE — Assessment & Plan Note (Signed)
Increase Metoprolol to '100mg'$  daily and see if both BP and anxiety improve.  Pt expressed understanding and is in agreement w/ plan.

## 2022-09-04 NOTE — Telephone Encounter (Signed)
Left pt a VM stating lab results  

## 2022-09-04 NOTE — Progress Notes (Signed)
   Subjective:    Patient ID: Jackie Mcconnell, female    DOB: [birth year], 52 y.o.   MRN: [id number]  HPI CPE- UTD on colonoscopy.  Due for mammo, Tdap.  Patient Care Team    Relationship Specialty Notifications Start End  [name], MD PCP - General Family Medicine  09/28/16   Kennith Center, RD Dietitian Family Medicine  12/02/13     Health Maintenance  Topic Date Due   DTaP/Tdap/Td (2 - Td or Tdap) 12/19/2020   MAMMOGRAM  07/28/2022   INFLUENZA VACCINE  09/24/2022 (Originally 01/24/2022)   Zoster Vaccines- Shingrix (1 of 2) 12/05/2022 (Originally 11/03/1990)   COLONOSCOPY (Pts 45-65yrs Insurance coverage will need to be confirmed)  02/23/2032   Hepatitis C Screening  Completed   HPV VACCINES  Aged Out   COVID-19 Vaccine  Discontinued   HIV Screening  Discontinued      Review of Systems Patient reports no vision/ hearing changes, adenopathy,fever,  persistant/recurrent hoarseness , swallowing issues, chest pain, palpitations, edema, persistant/recurrent cough, hemoptysis, dyspnea (rest/exertional/paroxysmal nocturnal), gastrointestinal bleeding (melena, rectal bleeding), abdominal pain, significant heartburn, bowel changes, GU symptoms (dysuria, hematuria, incontinence), Gyn symptoms (abnormal  bleeding, pain),  syncope, focal weakness, memory loss, numbness & tingling, skin/hair/nail changes, abnormal bruising or bleeding, or depression.   + 5 lb weight gain + anxiety- has moved 3x in last year, reconciled w/ wife, work is stressful      Objective:   Physical Exam General Appearance:    Alert, cooperative, no distress, appears stated age, obese  Head:    Normocephalic, without obvious abnormality, atraumatic  Eyes:    PERRL, conjunctiva/corneas clear, EOM's intact both eyes  Ears:    Normal TM's and external ear canals, both ears  Nose:   Nares normal, septum midline, mucosa normal, no drainage    or sinus tenderness  Throat:   Lips, mucosa, and tongue normal; teeth and gums  normal  Neck:   Supple, symmetrical, trachea midline, no adenopathy;    Thyroid: no enlargement/tenderness/nodules  Back:     Symmetric, no curvature, ROM normal, no CVA tenderness  Lungs:     Clear to auscultation bilaterally, respirations unlabored  Chest Wall:    No tenderness or deformity   Heart:    Regular rate and rhythm, S1 and S2 normal, no murmur, rub   or gallop  Breast Exam:    Deferred to GYN  Abdomen:     Soft, non-tender, bowel sounds active all four quadrants,    no masses, no organomegaly  Genitalia:    Deferred to GYN  Rectal:    Extremities:   Extremities normal, atraumatic, no cyanosis or edema  Pulses:   2+ and symmetric all extremities  Skin:   Skin color, texture, turgor normal, no rashes or lesions  Lymph nodes:   Cervical, supraclavicular, and axillary nodes normal  Neurologic:   CNII-XII intact, normal strength, sensation and reflexes    throughout          Assessment & Plan:

## 2022-09-04 NOTE — Assessment & Plan Note (Signed)
Ongoing issue for pt.  Has gained 5 lbs since last visit.  Stressed need for low carb diet and regular physical activity.  Check labs to risk stratify.  Will follow.

## 2022-09-04 NOTE — Patient Instructions (Signed)
Follow up in 3 weeks to recheck blood pressure and anxiety We'll notify you of your lab results and make any changes if needed SCHEDULE YOUR MAMMOGRAM Increase your metoprolol to '100mg'$  daily Call with any questions or concerns Stay Safe!  Stay Healthy! Hang in there!!!

## 2022-09-05 LAB — BASIC METABOLIC PANEL
BUN: 13 mg/dL (ref 6–23)
CO2: 25 mEq/L (ref 19–32)
Calcium: 10.2 mg/dL (ref 8.4–10.5)
Chloride: 101 mEq/L (ref 96–112)
Creatinine, Ser: 0.84 mg/dL (ref 0.40–1.20)
GFR: 80.79 mL/min (ref >60.00)
Glucose, Bld: 77 mg/dL (ref 70–99)
Potassium: 4.4 mEq/L (ref 3.5–5.1)
Sodium: 138 mEq/L (ref 135–145)

## 2022-09-05 LAB — LIPID PANEL
Cholesterol: 201 mg/dL — ABNORMAL HIGH (ref 0–200)
HDL: 59.3 mg/dL (ref >39.00)
LDL Cholesterol: 123 mg/dL — ABNORMAL HIGH (ref 0–99)
NonHDL: 141.27
Total CHOL/HDL Ratio: 3
Triglycerides: 93 mg/dL (ref 0.0–149.0)
VLDL: 18.6 mg/dL (ref 0.0–40.0)

## 2022-09-05 LAB — HEPATIC FUNCTION PANEL
ALT: 20 U/L (ref 0–35)
AST: 20 U/L (ref 0–37)
Albumin: 4.3 g/dL (ref 3.5–5.2)
Alkaline Phosphatase: 59 U/L (ref 39–117)
Bilirubin, Direct: 0 mg/dL (ref 0.0–0.3)
Total Bilirubin: 0.5 mg/dL (ref 0.2–1.2)
Total Protein: 7.4 g/dL (ref 6.0–8.3)

## 2022-09-05 LAB — CBC WITH DIFFERENTIAL/PLATELET
Basophils Absolute: 0 10*3/uL (ref 0.0–0.1)
Basophils Relative: 0.7 % (ref 0.0–3.0)
Eosinophils Absolute: 0.2 10*3/uL (ref 0.0–0.7)
Eosinophils Relative: 3.4 % (ref 0.0–5.0)
HCT: 42.6 % (ref 36.0–46.0)
Hemoglobin: 14.7 g/dL (ref 12.0–15.0)
Lymphocytes Relative: 32.2 % (ref 12.0–46.0)
Lymphs Abs: 2 10*3/uL (ref 0.7–4.0)
MCHC: 34.6 g/dL (ref 30.0–36.0)
MCV: 90.2 fl (ref 78.0–100.0)
Monocytes Absolute: 0.6 10*3/uL (ref 0.1–1.0)
Monocytes Relative: 8.8 % (ref 3.0–12.0)
Neutro Abs: 3.5 10*3/uL (ref 1.4–7.7)
Neutrophils Relative %: 54.9 % (ref 43.0–77.0)
Platelets: 256 10*3/uL (ref 150.0–400.0)
RBC: 4.72 Mil/uL (ref 3.87–5.11)
RDW: 13.4 % (ref 11.5–15.5)
WBC: 6.3 10*3/uL (ref 4.0–10.5)

## 2022-09-05 LAB — VITAMIN D 25 HYDROXY (VIT D DEFICIENCY, FRACTURES): VITD: 31.71 ng/mL (ref 30.00–100.00)

## 2022-09-05 LAB — TSH: TSH: 1.95 u[IU]/mL (ref 0.35–5.50)

## 2022-09-05 NOTE — Progress Notes (Signed)
New Patient Note  RE: [name] MRN: [id number] DOB: Aug 23, 1970 Date of Office Visit: 09/06/2022  Consult requested by: [name], MD Primary care provider: [name], MD  Chief Complaint: No chief complaint on file.  History of Present Illness: I had the pleasure of seeing Jackie Mcconnell for initial evaluation at the [facility] [place] [facility]  on 09/05/2022. She is a 52 y.o. female, who is referred here by [name], MD for the evaluation of ***.  ***  Assessment and Plan: [name] is a 52 y.o. female with: No problem-specific Assessment & Plan notes found for this encounter.  No follow-ups on file.  No orders of the defined types were placed in this encounter.  Lab Orders  No laboratory test(s) ordered today    Other allergy screening: Asthma: {Blank single:19197::"yes","no"} Rhino conjunctivitis: {Blank single:19197::"yes","no"} Food allergy: {Blank single:19197::"yes","no"} Medication allergy: {Blank single:19197::"yes","no"} Hymenoptera allergy: {Blank single:19197::"yes","no"} Urticaria: {Blank single:19197::"yes","no"} Eczema:{Blank single:19197::"yes","no"} History of recurrent infections suggestive of immunodeficency: {Blank single:19197::"yes","no"}  Diagnostics: Spirometry:  Tracings reviewed. Her effort: {Blank single:19197::"Good reproducible efforts.","It was hard to get consistent efforts and there is a question as to whether this reflects a maximal maneuver.","Poor effort, data can not be interpreted."} FVC: ***L FEV1: ***L, ***% predicted FEV1/FVC ratio: ***% Interpretation: {Blank single:19197::"Spirometry consistent with mild obstructive disease","Spirometry consistent with moderate obstructive disease","Spirometry consistent with severe obstructive disease","Spirometry consistent with possible restrictive disease","Spirometry consistent with mixed obstructive and restrictive disease","Spirometry uninterpretable due to  technique","Spirometry consistent with normal pattern","No overt abnormalities noted given today's efforts"}.  Please see scanned spirometry results for details.  Skin Testing: {Blank single:19197::"Select foods","Environmental allergy panel","Environmental allergy panel and select foods","Food allergy panel","None","Deferred due to recent antihistamines use"}. *** Results discussed with patient/family.   Past Medical History: Patient Active Problem List   Diagnosis Date Noted  . Long-term current use of proton pump inhibitor therapy 10/18/2021  . Chronic neck and back pain 09/24/2020  . PND (post-nasal drip) 09/24/2020  . Visit for preventive health examination 01/14/2019  . Benign cystic nephroma 08/08/2017  . GERD (gastroesophageal reflux disease) 08/08/2017  . Labile blood pressure 08/08/2017  . Physical exam 10/27/2016  . History of renal hypertension 12/20/2015  . Anxiety state 12/20/2015  . Obesity (BMI 35.0-39.9 without comorbidity) ([place]) 12/02/2013   Past Medical History:  Diagnosis Date  . Anxiety    due to school studies  . Complication of anesthesia   . GERD (gastroesophageal reflux disease) 08/08/2017  . History of kidney disease   . Hypertension   . PONV (postoperative nausea and vomiting)    Past Surgical History: Past Surgical History:  Procedure Laterality Date  . ABDOMINAL HYSTERECTOMY     complete 2011  . NEPHRECTOMY Left 04/06/2014   Procedure: NEPHRECTOMY;  Surgeon: Raynelle Bring, MD;  Location: [facility];  Service: Urology;  Laterality: Left;  . TOTAL ABDOMINAL HYSTERECTOMY     Medication List:  Current Outpatient Medications  Medication Sig Dispense Refill  . acetaminophen (TYLENOL) 500 MG tablet Take 1,000 mg by mouth once as needed for mild pain or headache.    . ALPRAZolam (XANAX) 0.5 MG tablet Take 0.5 mg by mouth at bedtime as needed for anxiety.    . cyclobenzaprine (FLEXERIL) 5 MG tablet TAKE 1 TABLET BY MOUTH 3 TIMES DAILY AS NEEDED FOR MUSCLE  SPASMS. 30 tablet 1  . docusate sodium (COLACE) 100 MG capsule Take 1 capsule (100 mg total) by mouth 2 (two) times daily. 40 capsule 0  . estradiol (ESTRACE)  0.5 MG tablet Take 1 tablet (0.5 mg total) by mouth every evening. 90 tablet 1  . fexofenadine (ALLEGRA ALLERGY) 180 MG tablet Take 1 tablet (180 mg total) by mouth daily. 90 tablet 1  . fluticasone (FLONASE) 50 MCG/ACT nasal spray Place 2 sprays into both nostrils daily. 16 g 6  . loratadine (CLARITIN) 10 MG tablet Take 10 mg by mouth daily.    . metoprolol succinate (TOPROL-XL) 100 MG 24 hr tablet Take 1 tablet (100 mg total) by mouth daily. Take with or immediately following a meal. 90 tablet 1  . Multiple Vitamin (MULTIVITAMIN) tablet Take 1 tablet by mouth daily.    Marland Kitchen nystatin (MYCOSTATIN/NYSTOP) powder Apply 1 application topically 3 (three) times daily. 15 g 0  . ondansetron (ZOFRAN) 4 MG tablet Take 1 tablet (4 mg total) by mouth every 8 (eight) hours as needed for nausea or vomiting. 20 tablet 0  . pantoprazole (PROTONIX) 40 MG tablet Take 1 tablet (40 mg total) by mouth 2 (two) times daily. 180 tablet 1  . traMADol (ULTRAM) 50 MG tablet Take 1 tablet (50 mg total) by mouth every 6 (six) hours as needed. 30 tablet 0   No current facility-administered medications for this visit.   Allergies: Allergies  Allergen Reactions  . Neosporin [Neomycin-Bacitracin Zn-Polymyx] Rash  . Sulfa Antibiotics Rash    rash   Social History: Social History   Socioeconomic History  . Marital status: Married    Spouse name: Not on file  . Number of children: 1  . Years of education: Not on file  . Highest education level: Not on file  Occupational History  . Not on file  Tobacco Use  . Smoking status: Never  . Smokeless tobacco: Never  Vaping Use  . Vaping Use: Never used  Substance and Sexual Activity  . Alcohol use: Yes    Alcohol/week: 2.0 - 3.0 standard drinks of alcohol    Types: 2 - 3 Standard drinks or equivalent per week  .  Drug use: No  . Sexual activity: Yes  Other Topics Concern  . Not on file  Social History Narrative  . Not on file   Social Determinants of Health   Financial Resource Strain: Not on file  Food Insecurity: Not on file  Transportation Needs: Not on file  Physical Activity: Not on file  Stress: Not on file  Social Connections: Not on file   Lives in a ***. Smoking: *** Occupation: ***  Environmental HistoryFreight forwarder in the house: Estate agent in the family room: {Blank single:19197::"yes","no"} Carpet in the bedroom: {Blank single:19197::"yes","no"} Heating: {Blank single:19197::"electric","gas","heat pump"} Cooling: {Blank single:19197::"central","window","heat pump"} Pet: {Blank single:19197::"yes ***","no"}  Family History: Family History  Problem Relation Age of Onset  . Hypertension Mother   . Bipolar disorder Mother   . Hypertension Father   . Hypertension Maternal Grandmother   . Hyperlipidemia Maternal Grandmother   . Diabetes Maternal Grandmother   . Hyperlipidemia Maternal Grandfather   . Hypertension Maternal Grandfather    Problem                               Relation Asthma                                   *** Eczema                                ***  Food allergy                          *** Allergic rhino conjunctivitis     ***  Review of Systems  Constitutional:  Negative for appetite change, chills, fever and unexpected weight change.  HENT:  Negative for congestion and rhinorrhea.   Eyes:  Negative for itching.  Respiratory:  Negative for cough, chest tightness, shortness of breath and wheezing.   Cardiovascular:  Negative for chest pain.  Gastrointestinal:  Negative for abdominal pain.  Genitourinary:  Negative for difficulty urinating.  Skin:  Negative for rash.  Neurological:  Negative for headaches.   Objective: There were no vitals taken for this visit. There is no height or weight on file to  calculate BMI. Physical Exam Vitals and nursing note reviewed.  Constitutional:      Appearance: Normal appearance. She is well-developed.  HENT:     Head: Normocephalic and atraumatic.     Right Ear: Tympanic membrane and external ear normal.     Left Ear: Tympanic membrane and external ear normal.     Nose: Nose normal.     Mouth/Throat:     Mouth: Mucous membranes are moist.     Pharynx: Oropharynx is clear.  Eyes:     Conjunctiva/sclera: Conjunctivae normal.  Cardiovascular:     Rate and Rhythm: Normal rate and regular rhythm.     Heart sounds: Normal heart sounds. No murmur heard.    No friction rub. No gallop.  Pulmonary:     Effort: Pulmonary effort is normal.     Breath sounds: Normal breath sounds. No wheezing, rhonchi or rales.  Musculoskeletal:     Cervical back: Neck supple.  Skin:    General: Skin is warm.     Findings: No rash.  Neurological:     Mental Status: She is alert and oriented to person, place, and time.  Psychiatric:        Behavior: Behavior normal.  The plan was reviewed with the patient/family, and all questions/concerned were addressed.  It was my pleasure to see Jackie Mcconnell today and participate in her care. Please feel free to contact me with any questions or concerns.  Sincerely,  Rexene Alberts, DO Allergy & Immunology  [facility] office: [place] office: [phone]

## 2022-09-06 ENCOUNTER — Telehealth: Payer: Self-pay

## 2022-09-06 ENCOUNTER — Other Ambulatory Visit: Payer: Self-pay

## 2022-09-06 ENCOUNTER — Encounter: Payer: Self-pay | Admitting: Allergy

## 2022-09-06 ENCOUNTER — Ambulatory Visit: Payer: Commercial Managed Care - PPO | Admitting: Allergy

## 2022-09-06 ENCOUNTER — Other Ambulatory Visit (HOSPITAL_COMMUNITY): Payer: Self-pay

## 2022-09-06 VITALS — BP 120/82 | HR 77 | Temp 98.1°F | Resp 18 | Ht 65.8 in | Wt 231.8 lb

## 2022-09-06 DIAGNOSIS — J4520 Mild intermittent asthma, uncomplicated: Secondary | ICD-10-CM | POA: Diagnosis not present

## 2022-09-06 DIAGNOSIS — R053 Chronic cough: Secondary | ICD-10-CM

## 2022-09-06 DIAGNOSIS — K219 Gastro-esophageal reflux disease without esophagitis: Secondary | ICD-10-CM

## 2022-09-06 DIAGNOSIS — J310 Chronic rhinitis: Secondary | ICD-10-CM

## 2022-09-06 MED ORDER — IPRATROPIUM BROMIDE 0.03 % NA SOLN
1.0000 | Freq: Two times a day (BID) | NASAL | 5 refills | Status: DC | PRN
  Filled 2022-09-06: qty 30, 75d supply, fill #0
  Filled 2022-11-08: qty 30, 75d supply, fill #1

## 2022-09-06 MED ORDER — ALBUTEROL SULFATE HFA 108 (90 BASE) MCG/ACT IN AERS
2.0000 | INHALATION_SPRAY | RESPIRATORY_TRACT | 1 refills | Status: DC | PRN
  Filled 2022-09-06: qty 6.7, 17d supply, fill #0
  Filled 2022-11-08: qty 6.7, 17d supply, fill #1

## 2022-09-06 NOTE — Telephone Encounter (Signed)
Per Dr. Maudie Mercury: Please refer patient to ENT for chronic coughing - look at vocal cord.

## 2022-09-06 NOTE — Patient Instructions (Addendum)
Today's skin testing showed: Negative to indoor/outdoor allergens and common foods.   Results given.  Coughing The most common causes of chronic cough include the following: upper airway cough syndrome (UACS) which is caused by variety of rhinitis conditions; asthma; gastroesophageal reflux disease (GERD); chronic bronchitis from cigarette smoking or other inhaled environmental irritants; non-asthmatic eosinophilic bronchitis; and bronchiectasis.  In prospective studies, these conditions have accounted for up to 94% of the causes of chronic cough in immunocompetent adults.   May use albuterol rescue inhaler 2 puffs every 4 to 6 hours as needed for shortness of breath, chest tightness, coughing, and wheezing. Monitor frequency of use.   Rhinitis Get bloodwork We are ordering labs, so please allow 1-2 weeks for the results to come back. With the newly implemented Cures Act, the labs might be visible to you at the same time that they become visible to me. However, I will not address the results until all of the results are back, so please be patient.  In the meantime, continue recommendations in your patient instructions, including avoidance measures (if applicable), until you hear from me. Use Atrovent (ipratropium) 0.03% 1-2 sprays per nostril twice a day as needed for runny nose/drainage. Nasal saline spray (i.e., Simply Saline) or nasal saline lavage (i.e., NeilMed) is recommended as needed and prior to medicated nasal sprays. Refer to ENT for chronic coughing - look at vocal cord.  Heartburn: See handout for lifestyle and dietary modifications. Continue Protonix '40mg'$  once a day. Nothing to eat or drink for 20-3 minutes afterwards.  Limit dairy products as it can thicken mucous.  Follow up in 3 months or sooner if needed.

## 2022-09-07 ENCOUNTER — Other Ambulatory Visit: Payer: Self-pay

## 2022-09-07 ENCOUNTER — Encounter: Payer: Self-pay | Admitting: Allergy

## 2022-09-07 DIAGNOSIS — R053 Chronic cough: Secondary | ICD-10-CM | POA: Insufficient documentation

## 2022-09-07 DIAGNOSIS — J310 Chronic rhinitis: Secondary | ICD-10-CM | POA: Insufficient documentation

## 2022-09-07 DIAGNOSIS — J45909 Unspecified asthma, uncomplicated: Secondary | ICD-10-CM | POA: Insufficient documentation

## 2022-09-07 NOTE — Assessment & Plan Note (Signed)
See handout for lifestyle and dietary modifications. Continue Protonix '40mg'$  once a day. Nothing to eat or drink for 20-3 minutes afterwards.  Limit dairy products as it can thicken mucous.

## 2022-09-07 NOTE — Assessment & Plan Note (Addendum)
Chronic coughing with throat clearing for 5+ years. Worse after meals. Follows with GI and had EGD. Takes PPI. Concerned about allergic triggers. No recent CXR. Sometimes gets coughing fits with the cold air in the winter months.  Today's skin prick testing showed: Negative to indoor/outdoor allergens and common foods however positive control was only borderline positive questioning the validity of the results. Confirmed with patient that she has not taken antihistamines for the past 3 days. Today's spirometry showed: normal pattern with 1% improvement in FEV1 post bronchodilator treatment. Clinically feeling unchanged.  The most common causes of chronic cough include the following: upper airway cough syndrome (UACS) which is caused by variety of rhinitis conditions; asthma; gastroesophageal reflux disease (GERD); chronic bronchitis from cigarette smoking or other inhaled environmental irritants; non-asthmatic eosinophilic bronchitis; and bronchiectasis.  In prospective studies, these conditions have accounted for up to 94% of the causes of chronic cough in immunocompetent adults.  Given patient's history - etiology unclear.  Will consider getting CXR if below work up negative and no improvement. For the cold air coughing May use albuterol rescue inhaler 2 puffs every 4 to 6 hours as needed for shortness of breath, chest tightness, coughing, and wheezing. Monitor frequency of use.  For the reflux See handout for lifestyle and dietary modifications. Continue Protonix '40mg'$  once a day. Nothing to eat or drink for 20-3 minutes afterwards.  Limit dairy products as it can thicken mucous. For possible PND and throat clearing Get environmental panel bloodwork. Use Atrovent (ipratropium) 0.03% 1-2 sprays per nostril twice a day as needed for runny nose/drainage. Nasal saline spray (i.e., Simply Saline) or nasal saline lavage (i.e., NeilMed) is recommended as needed and prior to medicated nasal sprays. Refer  to ENT for chronic coughing - look at vocal cord.

## 2022-09-07 NOTE — Assessment & Plan Note (Addendum)
Today's skin prick testing showed: Negative to indoor/outdoor allergens and common foods however positive control was only borderline positive questioning the validity of the results. Confirmed with patient that she has not taken antihistamines for the past 3 days. Get bloodwork. Use Atrovent (ipratropium) 0.03% 1-2 sprays per nostril twice a day as needed for runny nose/drainage. Nasal saline spray (i.e., Simply Saline) or nasal saline lavage (i.e., NeilMed) is recommended as needed and prior to medicated nasal sprays. Refer to ENT for chronic coughing - look at vocal cord.

## 2022-09-11 LAB — ALLERGENS W/TOTAL IGE AREA 2

## 2022-09-13 ENCOUNTER — Other Ambulatory Visit: Payer: Self-pay | Admitting: Family Medicine

## 2022-09-13 DIAGNOSIS — Z1231 Encounter for screening mammogram for malignant neoplasm of breast: Secondary | ICD-10-CM

## 2022-09-19 DIAGNOSIS — F419 Anxiety disorder, unspecified: Secondary | ICD-10-CM | POA: Diagnosis not present

## 2022-10-05 ENCOUNTER — Encounter: Payer: Self-pay | Admitting: Family Medicine

## 2022-10-05 ENCOUNTER — Other Ambulatory Visit (HOSPITAL_BASED_OUTPATIENT_CLINIC_OR_DEPARTMENT_OTHER): Payer: Self-pay

## 2022-10-05 ENCOUNTER — Ambulatory Visit: Payer: Commercial Managed Care - PPO | Admitting: Family Medicine

## 2022-10-05 VITALS — BP 126/70 | HR 61 | Temp 98.0°F | Ht 65.8 in | Wt 236.0 lb

## 2022-10-05 DIAGNOSIS — F411 Generalized anxiety disorder: Secondary | ICD-10-CM

## 2022-10-05 DIAGNOSIS — R0989 Other specified symptoms and signs involving the circulatory and respiratory systems: Secondary | ICD-10-CM | POA: Diagnosis not present

## 2022-10-05 MED ORDER — COMIRNATY 30 MCG/0.3ML IM SUSY
0.3000 mL | PREFILLED_SYRINGE | INTRAMUSCULAR | 0 refills | Status: DC
  Filled 2022-10-05: qty 0.3, 1d supply, fill #0

## 2022-10-05 NOTE — Assessment & Plan Note (Signed)
BP is much better controlled w/ the increased dose of Metoprolol.  Even when worked up, BP is now running 140s/90s which is better than it was.  She reports that with breathing techniques her BP will decrease to 120s/60s.  No med changes at this time.  Will follow.

## 2022-10-05 NOTE — Assessment & Plan Note (Signed)
Ongoing issue for pt.  She started taking Ashwaganda in hopes of improving her sxs.  She is not interested in taking a daily medication at this time despite recommendation from her counselor.  She has vacation upcoming and wants to see how she feels after that and will then let me know if she changes her mind about the SSRI.

## 2022-10-05 NOTE — Patient Instructions (Addendum)
No med changes at this time CONTINUE the Metoprolol 100mg  daily If you change your mind about medication- just let me know! Call with any questions or concerns Stay Safe!  Stay Healthy! HAVE FUN!!!

## 2022-10-05 NOTE — Progress Notes (Signed)
   Subjective:    Patient ID: Jackie Mcconnell, female    DOB: 05-23-1971, 52 y.o.   MRN: [id number]  HPI HTN- at last visit we increased Metoprolol to 100mg .  BP is much better controlled today- down 10 points on SBP and 14 on DBP.  Home BP's have ranged up to 140s/90s when she was particularly frustrated.   Anxiety- pt started taking Ashwaganda for stress.  Is taking Alprazolam as needed.  She doesn't want to take additional medication at this time.  Plans to go to the gym ~3x/week.    Review of Systems For ROS see HPI     Objective:   Physical Exam Vitals reviewed.  Constitutional:      General: She is not in acute distress.    Appearance: Normal appearance. She is well-developed. She is not ill-appearing.  HENT:     Head: Normocephalic and atraumatic.  Eyes:     Conjunctiva/sclera: Conjunctivae normal.     Pupils: Pupils are equal, round, and reactive to light.  Neck:     Thyroid: No thyromegaly.  Cardiovascular:     Rate and Rhythm: Normal rate and regular rhythm.     Heart sounds: Normal heart sounds. No murmur heard. Pulmonary:     Effort: Pulmonary effort is normal. No respiratory distress.     Breath sounds: Normal breath sounds.  Abdominal:     General: There is no distension.     Palpations: Abdomen is soft.     Tenderness: There is no abdominal tenderness.  Musculoskeletal:     Cervical back: Normal range of motion and neck supple.  Lymphadenopathy:     Cervical: No cervical adenopathy.  Skin:    General: Skin is warm and dry.  Neurological:     General: No focal deficit present.     Mental Status: She is alert and oriented to person, place, and time.  Psychiatric:        Mood and Affect: Mood normal.        Behavior: Behavior normal.           Assessment & Plan:

## 2022-10-06 ENCOUNTER — Other Ambulatory Visit (HOSPITAL_BASED_OUTPATIENT_CLINIC_OR_DEPARTMENT_OTHER): Payer: Self-pay

## 2022-10-11 DIAGNOSIS — F4323 Adjustment disorder with mixed anxiety and depressed mood: Secondary | ICD-10-CM | POA: Diagnosis not present

## 2022-11-07 ENCOUNTER — Ambulatory Visit
Admission: RE | Admit: 2022-11-07 | Discharge: 2022-11-07 | Disposition: A | Discharge status: Home / Self Care | Payer: Commercial Managed Care - PPO | Source: Ambulatory Visit | Attending: Family Medicine | Admitting: Family Medicine

## 2022-11-07 DIAGNOSIS — Z1231 Encounter for screening mammogram for malignant neoplasm of breast: Secondary | ICD-10-CM

## 2022-11-07 DIAGNOSIS — F419 Anxiety disorder, unspecified: Secondary | ICD-10-CM | POA: Diagnosis not present

## 2022-11-08 ENCOUNTER — Other Ambulatory Visit: Payer: Self-pay

## 2022-11-08 ENCOUNTER — Other Ambulatory Visit: Payer: Self-pay | Admitting: Family Medicine

## 2022-11-08 ENCOUNTER — Other Ambulatory Visit (HOSPITAL_COMMUNITY): Payer: Self-pay

## 2022-11-08 MED FILL — Cyclobenzaprine HCl Tab 5 MG: ORAL | 10 days supply | Qty: 30 | Fill #0 | Status: AC

## 2022-11-08 NOTE — Telephone Encounter (Signed)
Pt aware of refill.

## 2022-11-08 NOTE — Telephone Encounter (Signed)
Is this ok to refill ? Appears last refill was 2021

## 2022-12-04 ENCOUNTER — Other Ambulatory Visit (HOSPITAL_COMMUNITY): Payer: Self-pay

## 2022-12-06 DIAGNOSIS — F419 Anxiety disorder, unspecified: Secondary | ICD-10-CM | POA: Diagnosis not present

## 2022-12-18 DIAGNOSIS — F411 Generalized anxiety disorder: Secondary | ICD-10-CM | POA: Diagnosis not present

## 2023-01-02 ENCOUNTER — Other Ambulatory Visit: Payer: Self-pay | Admitting: Family Medicine

## 2023-01-02 ENCOUNTER — Other Ambulatory Visit: Payer: Self-pay | Admitting: Allergy

## 2023-01-02 DIAGNOSIS — K219 Gastro-esophageal reflux disease without esophagitis: Secondary | ICD-10-CM

## 2023-01-02 MED ORDER — ALBUTEROL SULFATE HFA 108 (90 BASE) MCG/ACT IN AERS
2.0000 | INHALATION_SPRAY | RESPIRATORY_TRACT | 0 refills | Status: DC | PRN
  Filled 2023-01-02: qty 6.7, 17d supply, fill #0

## 2023-01-02 NOTE — Telephone Encounter (Signed)
Called and and spoke to patient and informed her that she was due for a follow up per Dr. Elmyra Ricks AVS. Patient stated that she will call our office back to schedule her follow up.

## 2023-01-03 ENCOUNTER — Other Ambulatory Visit: Payer: Self-pay

## 2023-01-03 MED ORDER — ESTRADIOL 0.5 MG PO TABS
0.5000 mg | ORAL_TABLET | Freq: Every evening | ORAL | 1 refills | Status: DC
  Filled 2023-01-03: qty 90, 90d supply, fill #0
  Filled 2023-05-15: qty 90, 90d supply, fill #1

## 2023-01-03 MED ORDER — PANTOPRAZOLE SODIUM 40 MG PO TBEC
40.0000 mg | DELAYED_RELEASE_TABLET | Freq: Two times a day (BID) | ORAL | 1 refills | Status: DC
Start: 2023-01-03 — End: 2023-08-21
  Filled 2023-01-03: qty 180, 90d supply, fill #0
  Filled 2023-05-15: qty 180, 90d supply, fill #1

## 2023-01-04 DIAGNOSIS — F411 Generalized anxiety disorder: Secondary | ICD-10-CM | POA: Diagnosis not present

## 2023-01-05 ENCOUNTER — Ambulatory Visit: Payer: Commercial Managed Care - PPO | Admitting: Family Medicine

## 2023-01-05 ENCOUNTER — Encounter: Payer: Self-pay | Admitting: Family Medicine

## 2023-01-05 ENCOUNTER — Telehealth: Payer: Self-pay

## 2023-01-05 VITALS — BP 124/74 | HR 57 | Temp 98.0°F | Ht 66.0 in | Wt 233.0 lb

## 2023-01-05 DIAGNOSIS — J302 Other seasonal allergic rhinitis: Secondary | ICD-10-CM | POA: Diagnosis not present

## 2023-01-05 DIAGNOSIS — J069 Acute upper respiratory infection, unspecified: Secondary | ICD-10-CM | POA: Diagnosis not present

## 2023-01-05 DIAGNOSIS — J029 Acute pharyngitis, unspecified: Secondary | ICD-10-CM | POA: Diagnosis not present

## 2023-01-05 LAB — POC COVID19 BINAXNOW: SARS Coronavirus 2 Ag: NEGATIVE

## 2023-01-05 LAB — POCT RAPID STREP A (OFFICE): Rapid Strep A Screen: NEGATIVE

## 2023-01-05 NOTE — Telephone Encounter (Signed)
Will make pt aware.   

## 2023-01-05 NOTE — Progress Notes (Signed)
Acute Office Visit   Subjective:  Patient ID: Jackie Mcconnell, female    DOB: 07/06/1970, 52 y.o.   MRN: [id number]  Chief Complaint  Patient presents with   Sore Throat    Sore throat for 1 week Pt reports fatigue  OTC -Tylenol   HPI:  Patient is a 52 year old female that presents with:  +sore throat +fatigue  +nasal drainage-chronic +chronic cough-intermittent productive +nasal congestion   Symptoms started a week ago, intermittent   -fever -chest pain  -shortness of breath -headache -ear ache or drainage  Not been around anyone known of being ill, but been in a large crowd recently.  She reports she has tried Tylenol and OTC generic Claritin.   ROS See HPI above      Objective:   BP 124/74   Pulse (!) 57   Temp 98 F (36.7 C)   Ht 5\' 6"  (1.676 m)   Wt 233 lb (105.7 kg)   SpO2 97%   BMI 37.61 kg/m    Physical Exam Vitals reviewed.  Constitutional:      General: She is not in acute distress.    Appearance: Normal appearance. She is obese. She is not ill-appearing, toxic-appearing or diaphoretic.  HENT:     Head: Normocephalic and atraumatic.     Right Ear: Tympanic membrane and ear canal normal. There is no impacted cerumen.     Left Ear: Tympanic membrane and ear canal normal. There is no impacted cerumen.     Nose:     Right Sinus: No maxillary sinus tenderness or frontal sinus tenderness.     Left Sinus: No maxillary sinus tenderness or frontal sinus tenderness.     Mouth/Throat:     Pharynx: Oropharynx is clear. Uvula midline. No pharyngeal swelling, oropharyngeal exudate, posterior oropharyngeal erythema or uvula swelling.  Eyes:     General:        Right eye: No discharge.        Left eye: No discharge.     Conjunctiva/sclera: Conjunctivae normal.  Cardiovascular:     Rate and Rhythm: Normal rate and regular rhythm.     Heart sounds: Normal heart sounds. No murmur heard.    No friction rub. No gallop.  Pulmonary:     Effort: Pulmonary  effort is normal. No respiratory distress.     Breath sounds: Normal breath sounds.  Musculoskeletal:        General: Normal range of motion.  Lymphadenopathy:     Head:     Right side of head: No submental or submandibular adenopathy.     Left side of head: No submental or submandibular adenopathy.  Skin:    General: Skin is warm and dry.  Neurological:     General: No focal deficit present.     Mental Status: She is alert and oriented to person, place, and time. Mental status is at baseline.  Psychiatric:        Mood and Affect: Mood normal.        Behavior: Behavior normal.        Thought Content: Thought content normal.        Judgment: Judgment normal.      Assessment & Plan:  Sore throat -     POC COVID-19 BinaxNow -     POCT rapid strep A  Seasonal allergies  Viral upper respiratory illness  -Rapid covid and strep throat are negative. -Suspect your symptoms are more related to seasonal allergies with  symptoms not being consistent, but could be a viral illness.  -Recommend to take over the counter Zyrtec 10mg  at bedtime.  -You can take Flonase and Atrovent. -You may take Mucinex for cough if needed. Lungs are clear on exam. -Rest, hydrate -If not improved by the end of next week, follow up.   Zandra Abts, NP

## 2023-01-05 NOTE — Telephone Encounter (Signed)
Happy to manage for her

## 2023-01-05 NOTE — Patient Instructions (Signed)
-  Rapid covid and strep throat are negative. -Suspect your symptoms are more related to seasonal allergies with symptoms not being consistent, but could be a viral illness.  -Recommend to take over the counter Zyrtec 10mg  at bedtime.  -You can take Flonase and Atrovent. -You may take Mucinex for cough if needed. Lungs are clear on exam. -Rest, hydrate -If not improved by the end of next week, follow up.

## 2023-01-11 DIAGNOSIS — F411 Generalized anxiety disorder: Secondary | ICD-10-CM | POA: Diagnosis not present

## 2023-01-18 DIAGNOSIS — F411 Generalized anxiety disorder: Secondary | ICD-10-CM | POA: Diagnosis not present

## 2023-01-23 DIAGNOSIS — F419 Anxiety disorder, unspecified: Secondary | ICD-10-CM | POA: Diagnosis not present

## 2023-01-25 ENCOUNTER — Other Ambulatory Visit: Payer: Self-pay | Admitting: Allergy

## 2023-01-25 ENCOUNTER — Other Ambulatory Visit: Payer: Self-pay

## 2023-01-25 MED ORDER — ALBUTEROL SULFATE HFA 108 (90 BASE) MCG/ACT IN AERS
2.0000 | INHALATION_SPRAY | RESPIRATORY_TRACT | 0 refills | Status: AC | PRN
  Filled 2023-01-25: qty 6.7, 17d supply, fill #0

## 2023-02-01 ENCOUNTER — Other Ambulatory Visit (HOSPITAL_COMMUNITY): Payer: Self-pay

## 2023-02-01 DIAGNOSIS — F411 Generalized anxiety disorder: Secondary | ICD-10-CM | POA: Diagnosis not present

## 2023-02-15 DIAGNOSIS — F411 Generalized anxiety disorder: Secondary | ICD-10-CM | POA: Diagnosis not present

## 2023-02-22 DIAGNOSIS — F411 Generalized anxiety disorder: Secondary | ICD-10-CM | POA: Diagnosis not present

## 2023-03-01 ENCOUNTER — Ambulatory Visit (INDEPENDENT_AMBULATORY_CARE_PROVIDER_SITE_OTHER): Payer: Commercial Managed Care - PPO

## 2023-03-01 DIAGNOSIS — F411 Generalized anxiety disorder: Secondary | ICD-10-CM | POA: Diagnosis not present

## 2023-03-01 DIAGNOSIS — Z23 Encounter for immunization: Secondary | ICD-10-CM

## 2023-03-01 NOTE — Progress Notes (Signed)
Jackie Mcconnell is a 52 y.o. female presents to the office today for Tdap injections, per physician's orders.  Date due: 02/28/2033, appt made No   [name]  -Clarnce Flock, CMA student

## 2023-03-08 DIAGNOSIS — F411 Generalized anxiety disorder: Secondary | ICD-10-CM | POA: Diagnosis not present

## 2023-03-27 ENCOUNTER — Other Ambulatory Visit (HOSPITAL_COMMUNITY): Payer: Self-pay

## 2023-03-27 MED ORDER — OMRON 3 SERIES BP MONITOR DEVI
0 refills | Status: AC
  Filled 2023-03-27: qty 1, 30d supply, fill #0

## 2023-03-30 ENCOUNTER — Other Ambulatory Visit (HOSPITAL_COMMUNITY): Payer: Self-pay

## 2023-04-03 DIAGNOSIS — F419 Anxiety disorder, unspecified: Secondary | ICD-10-CM | POA: Diagnosis not present

## 2023-04-24 ENCOUNTER — Other Ambulatory Visit: Payer: Self-pay

## 2023-05-09 DIAGNOSIS — F419 Anxiety disorder, unspecified: Secondary | ICD-10-CM | POA: Diagnosis not present

## 2023-05-15 ENCOUNTER — Other Ambulatory Visit: Payer: Self-pay | Admitting: Family Medicine

## 2023-05-16 ENCOUNTER — Other Ambulatory Visit: Payer: Self-pay

## 2023-05-16 ENCOUNTER — Other Ambulatory Visit (HOSPITAL_COMMUNITY): Payer: Self-pay

## 2023-05-16 MED ORDER — TRAMADOL HCL 50 MG PO TABS: 50.0000 mg | ORAL_TABLET | Freq: Four times a day (QID) | ORAL | 0 refills | Status: DC | PRN

## 2023-05-16 MED ORDER — TRAMADOL HCL 50 MG PO TABS
50.0000 mg | ORAL_TABLET | Freq: Four times a day (QID) | ORAL | 0 refills | Status: DC | PRN
  Filled 2023-05-16: qty 30, 8d supply, fill #0

## 2023-05-16 MED ORDER — METOPROLOL SUCCINATE ER 100 MG PO TB24
100.0000 mg | ORAL_TABLET | Freq: Every day | ORAL | 1 refills | Status: DC
  Filled 2023-05-16: qty 90, 90d supply, fill #0
  Filled 2023-08-21 – 2023-08-29 (×2): qty 90, 90d supply, fill #1

## 2023-05-16 NOTE — Telephone Encounter (Signed)
Requested Prescriptions   Pending Prescriptions Disp Refills   traMADol (ULTRAM) 50 MG tablet 30 tablet 0    Sig: Take 1 tablet (50 mg total) by mouth every 6 (six) hours as needed.     Date of patient request: 05/16/2023 OV: 01/05/2023 Date of last refill: 09/04/2022 Last refill amount: 30

## 2023-06-01 ENCOUNTER — Other Ambulatory Visit (HOSPITAL_COMMUNITY): Payer: Self-pay

## 2023-06-01 ENCOUNTER — Ambulatory Visit: Payer: Commercial Managed Care - PPO | Admitting: Family Medicine

## 2023-06-01 ENCOUNTER — Encounter: Payer: Self-pay | Admitting: Family Medicine

## 2023-06-01 VITALS — BP 118/72 | HR 73 | Temp 98.7°F | Ht 66.0 in | Wt 229.1 lb

## 2023-06-01 DIAGNOSIS — R051 Acute cough: Secondary | ICD-10-CM | POA: Diagnosis not present

## 2023-06-01 MED ORDER — PROMETHAZINE-DM 6.25-15 MG/5ML PO SYRP
5.0000 mL | ORAL_SOLUTION | Freq: Four times a day (QID) | ORAL | 0 refills | Status: DC | PRN
  Filled 2023-06-01: qty 180, 9d supply, fill #0

## 2023-06-01 MED ORDER — AZITHROMYCIN 250 MG PO TABS
ORAL_TABLET | ORAL | 0 refills | Status: AC
  Filled 2023-06-01: qty 6, 5d supply, fill #0

## 2023-06-01 NOTE — Patient Instructions (Signed)
Follow up as needed or as scheduled START the Zpack as directed USE the cough syrup for nights and weekends- may cause drowsiness ADD the inhaler for cough/wheezing/shortness of breath Drink LOTS of fluids REST! Call with any questions or concerns Hang in there!!

## 2023-06-01 NOTE — Progress Notes (Signed)
   Subjective:    Patient ID: Jackie Mcconnell, female    DOB: May 18, 1971, 52 y.o.   MRN: [id number]  HPI Cough- sxs started 8 days ago.  Pt reports there has been a rattle and crackles and wheezes in her chest.  Cough is productive of white sputum.  Taking 1200 Mucinex BID.  Tm 100.8 on Saturday.  Negative COVID test.  Denies sinus pain/pressure.  Chest is burning w/ cough.   Review of Systems For ROS see HPI     Objective:   Physical Exam Vitals reviewed.  Constitutional:      General: Jackie Mcconnell is not in acute distress.    Appearance: Normal appearance. Jackie Mcconnell is not ill-appearing.  HENT:     Head: Normocephalic and atraumatic.     Nose: No congestion or rhinorrhea.     Comments: No TTP over frontal or maxillary sinuses Cardiovascular:     Rate and Rhythm: Normal rate and regular rhythm.  Pulmonary:     Effort: Pulmonary effort is normal.     Comments: + cough w/ faint crackles of L anterior upper lobe and scattered wheezes Musculoskeletal:     Cervical back: Neck supple.  Lymphadenopathy:     Cervical: No cervical adenopathy.  Skin:    General: Skin is warm and dry.  Neurological:     General: No focal deficit present.     Mental Status: Jackie Mcconnell is alert and oriented to person, place, and time.  Psychiatric:        Mood and Affect: Mood normal.        Behavior: Behavior normal.        Thought Content: Thought content normal.           Assessment & Plan:  Cough- new.  Concerning that Jackie Mcconnell is able to hear audible wheezes and crackles.  Reports feeling worse today than previous days and today is day 8.  At this point, Jackie Mcconnell should be improving.  Given the surge of mycoplasma in the community will start Zpack.  Encouraged her to use her inhaler.  Cough meds prn.  Reviewed supportive care and red flags that should prompt return.  Pt expressed understanding and is in agreement w/ plan.

## 2023-06-13 DIAGNOSIS — F419 Anxiety disorder, unspecified: Secondary | ICD-10-CM | POA: Diagnosis not present

## 2023-06-15 ENCOUNTER — Other Ambulatory Visit: Payer: Self-pay | Admitting: Family Medicine

## 2023-06-15 ENCOUNTER — Other Ambulatory Visit (HOSPITAL_COMMUNITY): Payer: Self-pay

## 2023-06-15 MED ORDER — ALPRAZOLAM 0.5 MG PO TABS
0.5000 mg | ORAL_TABLET | Freq: Every evening | ORAL | 3 refills | Status: DC | PRN
  Filled 2023-06-15: qty 30, 30d supply, fill #0
  Filled 2023-08-07 – 2023-08-21 (×2): qty 30, 30d supply, fill #1

## 2023-06-26 ENCOUNTER — Other Ambulatory Visit (HOSPITAL_COMMUNITY): Payer: Self-pay

## 2023-07-24 DIAGNOSIS — F419 Anxiety disorder, unspecified: Secondary | ICD-10-CM | POA: Diagnosis not present

## 2023-08-07 ENCOUNTER — Other Ambulatory Visit: Payer: Self-pay

## 2023-08-07 ENCOUNTER — Other Ambulatory Visit (HOSPITAL_COMMUNITY): Payer: Self-pay

## 2023-08-19 IMAGING — MG MM DIGITAL SCREENING BILAT W/ TOMO AND CAD
8 series · 8 of 24 positions shown · non-contrast
Comparison: Previous exam(s).

ACR Breast Density Category a: The breast tissue is almost entirely
fatty.

CLINICAL DATA: Screening.

EXAM:
DIGITAL SCREENING BILATERAL MAMMOGRAM WITH TOMOSYNTHESIS AND CAD
TECHNIQUE: Bilateral screening digital craniocaudal and mediolateral oblique
mammograms were obtained. Bilateral screening digital breast
tomosynthesis was performed. The images were evaluated with
computer-aided detection.

[L CC synth-2D]
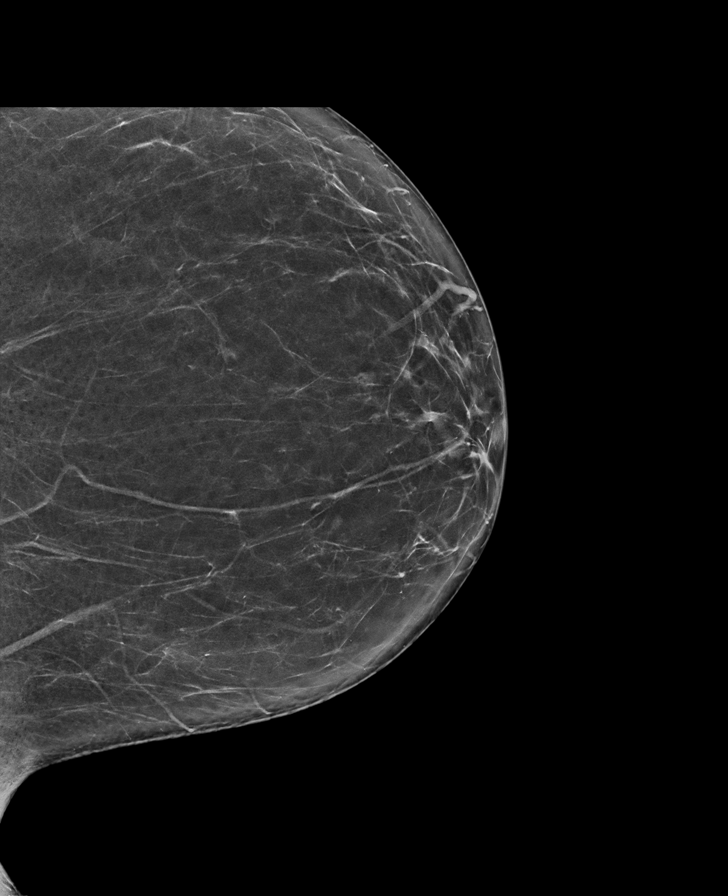

[L MLO synth-2D]
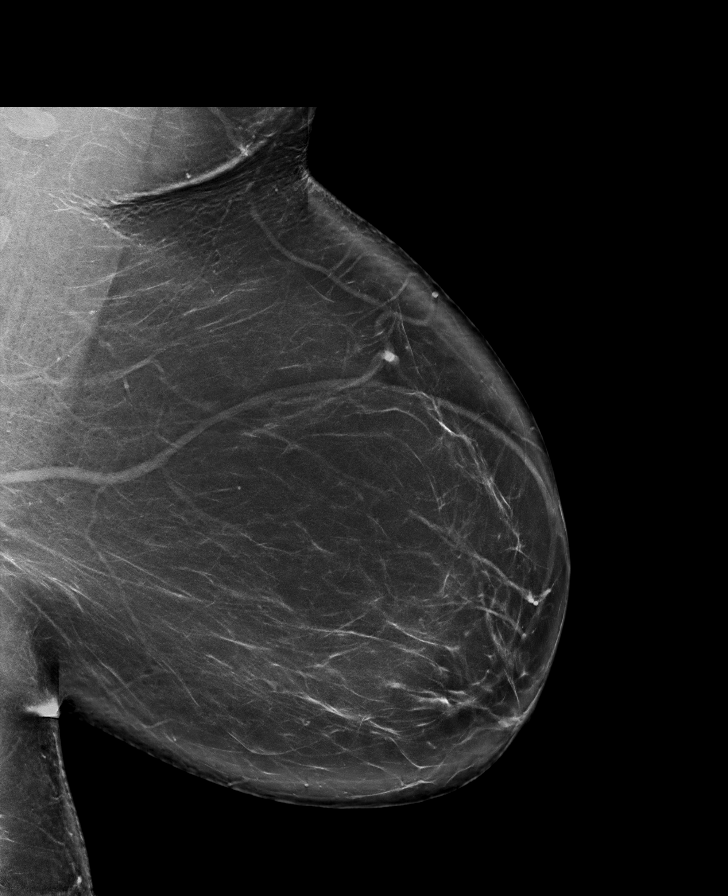

[R CC synth-2D]
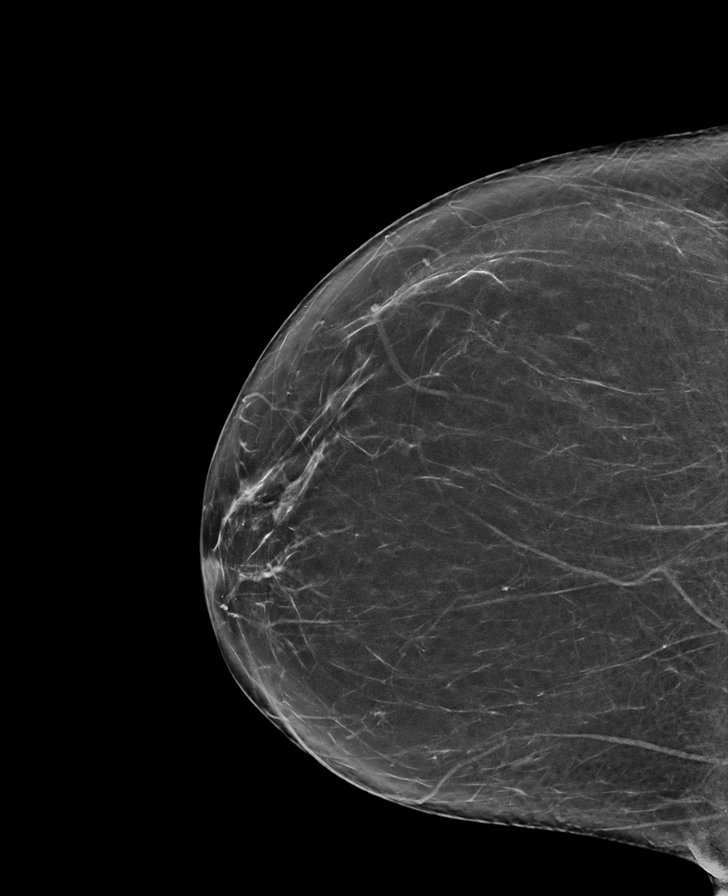

[R MLO synth-2D]
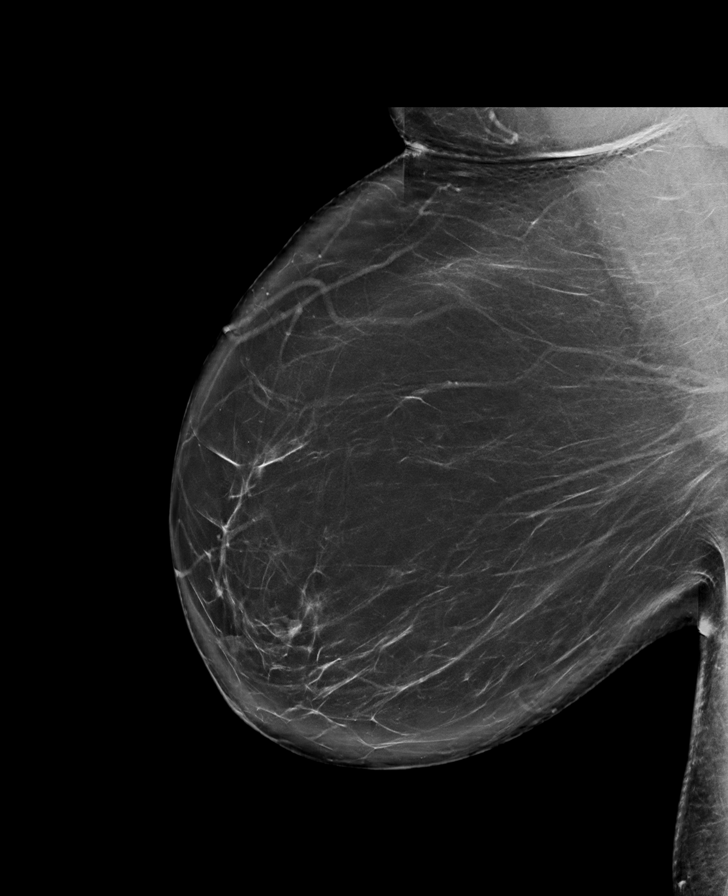

[R MLO tomo · tomo slice 49/98.0]
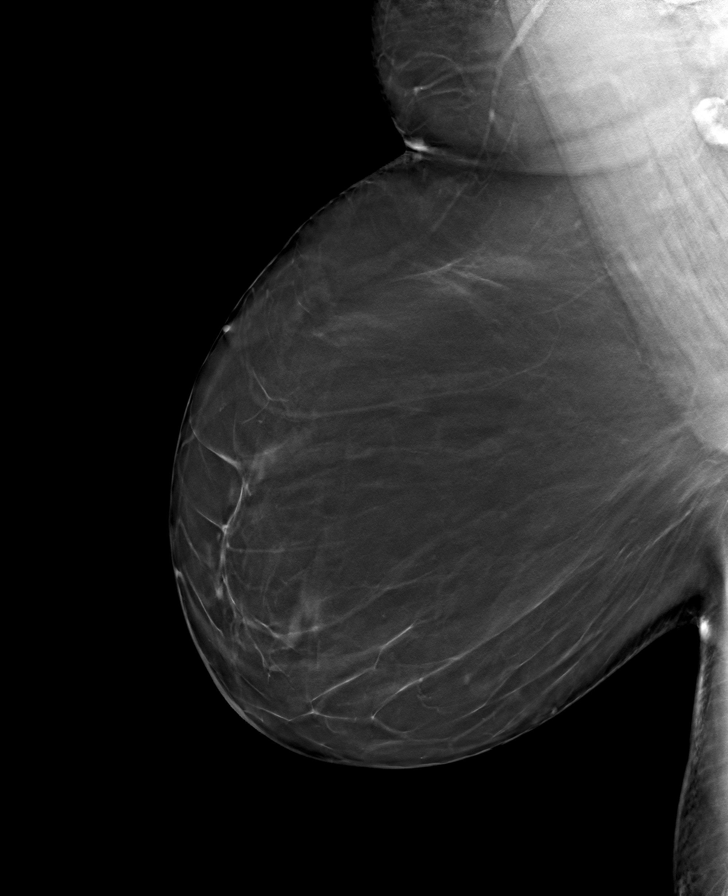

[R CC tomo · tomo slice 37/73.0]
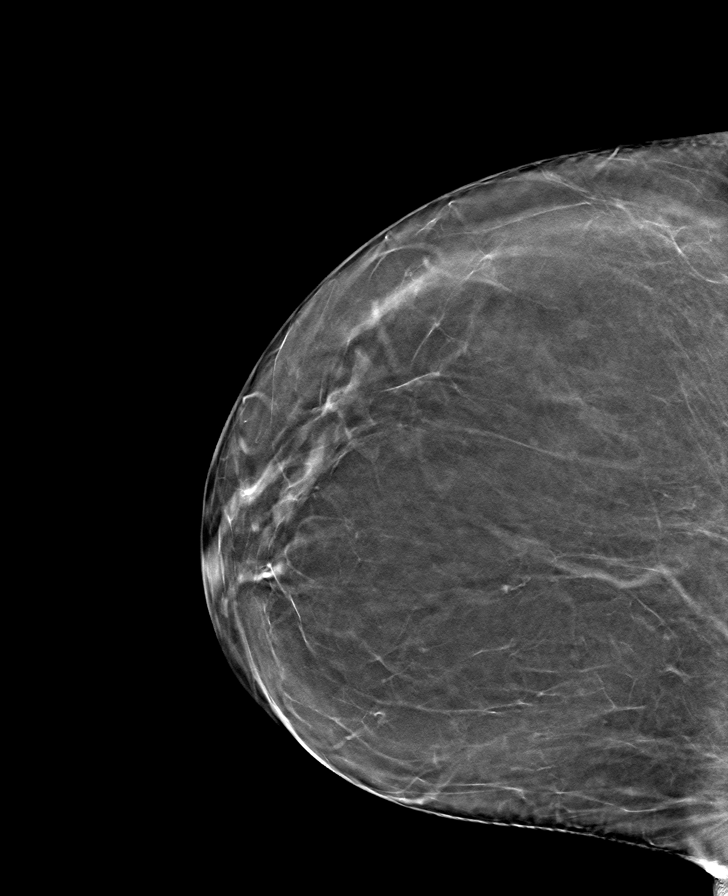

[L CC tomo · tomo slice 37/73.0]
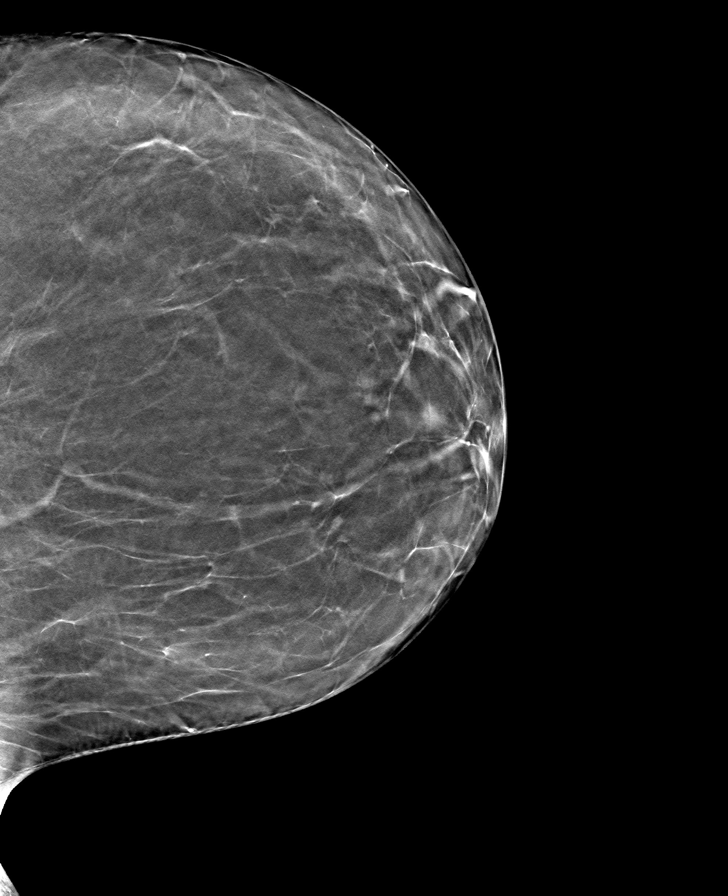

[L MLO tomo · tomo slice 51/101.0]
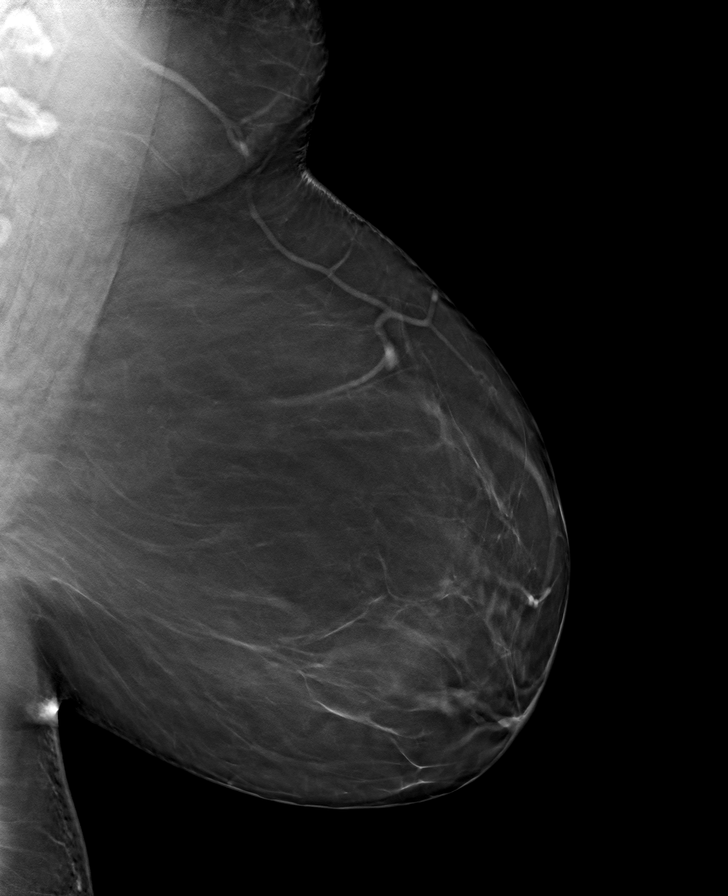

[8 of 24 positions shown; findings below may reference images not displayed]

FINDINGS: There are no findings suspicious for malignancy.
IMPRESSION: No mammographic evidence of malignancy. A result letter of this
screening mammogram will be mailed directly to the patient.

RECOMMENDATION:
Screening mammogram in one year. (Code:0E-3-N98)

BI-RADS CATEGORY  1: Negative.

## 2023-08-20 ENCOUNTER — Other Ambulatory Visit (HOSPITAL_COMMUNITY): Payer: Self-pay

## 2023-08-21 ENCOUNTER — Other Ambulatory Visit: Payer: Self-pay | Admitting: Family Medicine

## 2023-08-21 ENCOUNTER — Other Ambulatory Visit (HOSPITAL_COMMUNITY): Payer: Self-pay

## 2023-08-21 ENCOUNTER — Other Ambulatory Visit: Payer: Self-pay

## 2023-08-21 DIAGNOSIS — K219 Gastro-esophageal reflux disease without esophagitis: Secondary | ICD-10-CM

## 2023-08-21 MED ORDER — PANTOPRAZOLE SODIUM 40 MG PO TBEC
40.0000 mg | DELAYED_RELEASE_TABLET | Freq: Two times a day (BID) | ORAL | 1 refills | Status: AC
  Filled 2023-08-21 – 2023-09-14 (×2): qty 180, 90d supply, fill #0
  Filled 2024-03-21: qty 180, 90d supply, fill #1

## 2023-08-21 MED ORDER — TRAMADOL HCL 50 MG PO TABS
50.0000 mg | ORAL_TABLET | Freq: Four times a day (QID) | ORAL | 0 refills | Status: DC | PRN
  Filled 2023-08-21 – 2023-09-14 (×2): qty 30, 8d supply, fill #0

## 2023-08-21 MED FILL — Cyclobenzaprine HCl Tab 5 MG: ORAL | 10 days supply | Qty: 30 | Fill #1 | Status: CN

## 2023-08-22 ENCOUNTER — Other Ambulatory Visit: Payer: Self-pay

## 2023-08-24 ENCOUNTER — Other Ambulatory Visit: Payer: Self-pay

## 2023-08-27 ENCOUNTER — Other Ambulatory Visit: Payer: Self-pay

## 2023-08-27 ENCOUNTER — Other Ambulatory Visit (HOSPITAL_COMMUNITY): Payer: Self-pay

## 2023-08-29 ENCOUNTER — Other Ambulatory Visit: Payer: Self-pay

## 2023-08-29 ENCOUNTER — Other Ambulatory Visit (HOSPITAL_COMMUNITY): Payer: Self-pay

## 2023-08-29 DIAGNOSIS — F4323 Adjustment disorder with mixed anxiety and depressed mood: Secondary | ICD-10-CM | POA: Diagnosis not present

## 2023-08-29 MED FILL — Cyclobenzaprine HCl Tab 5 MG: ORAL | 10 days supply | Qty: 30 | Fill #1 | Status: AC

## 2023-09-11 DIAGNOSIS — F419 Anxiety disorder, unspecified: Secondary | ICD-10-CM | POA: Diagnosis not present

## 2023-09-12 ENCOUNTER — Other Ambulatory Visit: Payer: Self-pay

## 2023-09-12 ENCOUNTER — Other Ambulatory Visit (HOSPITAL_COMMUNITY): Payer: Self-pay

## 2023-09-14 ENCOUNTER — Other Ambulatory Visit: Payer: Self-pay

## 2023-09-14 ENCOUNTER — Other Ambulatory Visit (HOSPITAL_COMMUNITY): Payer: Self-pay

## 2023-09-20 DIAGNOSIS — M7672 Peroneal tendinitis, left leg: Secondary | ICD-10-CM | POA: Diagnosis not present

## 2023-09-20 DIAGNOSIS — M2011 Hallux valgus (acquired), right foot: Secondary | ICD-10-CM | POA: Diagnosis not present

## 2023-09-20 DIAGNOSIS — M2012 Hallux valgus (acquired), left foot: Secondary | ICD-10-CM | POA: Diagnosis not present

## 2023-09-20 DIAGNOSIS — M792 Neuralgia and neuritis, unspecified: Secondary | ICD-10-CM | POA: Diagnosis not present

## 2023-09-20 DIAGNOSIS — M722 Plantar fascial fibromatosis: Secondary | ICD-10-CM | POA: Diagnosis not present

## 2023-09-26 DIAGNOSIS — F4323 Adjustment disorder with mixed anxiety and depressed mood: Secondary | ICD-10-CM | POA: Diagnosis not present

## 2023-10-04 DIAGNOSIS — F4323 Adjustment disorder with mixed anxiety and depressed mood: Secondary | ICD-10-CM | POA: Diagnosis not present

## 2023-10-10 ENCOUNTER — Ambulatory Visit: Admitting: Dietician

## 2023-10-11 NOTE — Progress Notes (Signed)
 Medical Nutrition Therapy    Appointment Start time:  15  Appointment End time:  1156  Primary concerns today: weight management  Referral diagnosis: employee visit 1 Preferred learning style:  no preference indicated Learning readiness:  ready-change in progress   NUTRITION ASSESSMENT    Clinical Medical Hx:  Past Medical History:  Diagnosis Date   Anxiety    due to school studies   Complication of anesthesia    GERD (gastroesophageal reflux disease) 08/08/2017   History of kidney disease    Hypertension    PONV (postoperative nausea and vomiting)     Medications:  Current Outpatient Medications:    acetaminophen  (TYLENOL ) 500 MG tablet, Take 1,000 mg by mouth once as needed for mild pain or headache., Disp: , Rfl:    albuterol  (VENTOLIN  HFA) 108 (90 Base) MCG/ACT inhaler, Inhale 2 puffs into the lungs every 4 (four) hours as needed for wheezing or shortness of breath (coughing fits)., Disp: 6.7 g, Rfl: 0   ALPRAZolam  (XANAX ) 0.5 MG tablet, Take 1 tablet (0.5 mg total) by mouth at bedtime as needed for anxiety., Disp: 30 tablet, Rfl: 3   Blood Pressure Monitoring (OMRON 3 SERIES BP MONITOR) DEVI, Use as directed, Disp: 1 each, Rfl: 0   cyclobenzaprine  (FLEXERIL ) 5 MG tablet, Take 1 tablet (5 mg total) by mouth 3 (three) times daily as needed for muscle spasms., Disp: 30 tablet, Rfl: 1   docusate sodium  (COLACE) 100 MG capsule, Take 1 capsule (100 mg total) by mouth 2 (two) times daily., Disp: 40 capsule, Rfl: 0   estradiol  (ESTRACE ) 0.5 MG tablet, Take 1 tablet (0.5 mg total) by mouth every evening., Disp: 90 tablet, Rfl: 1   ipratropium (ATROVENT ) 0.03 % nasal spray, Place 1-2 sprays into both nostrils 2 (two) times daily as needed (nasal drainage)., Disp: 30 mL, Rfl: 5   metoprolol  succinate (TOPROL -XL) 100 MG 24 hr tablet, Take 1 tablet (100 mg total) by mouth daily. Take with or immediately following a meal., Disp: 90 tablet, Rfl: 1   Multiple Vitamin (MULTIVITAMIN) tablet,  Take 1 tablet by mouth daily., Disp: , Rfl:    ondansetron  (ZOFRAN ) 4 MG tablet, Take 1 tablet (4 mg total) by mouth every 8 (eight) hours as needed for nausea or vomiting., Disp: 20 tablet, Rfl: 0   pantoprazole  (PROTONIX ) 40 MG tablet, Take 1 tablet (40 mg total) by mouth 2 (two) times daily., Disp: 180 tablet, Rfl: 1   traMADol  (ULTRAM ) 50 MG tablet, Take 1 tablet (50 mg total) by mouth every 6 (six) hours as needed., Disp: 30 tablet, Rfl: 0   loratadine (CLARITIN) 10 MG tablet, Take 10 mg by mouth daily. (Patient not taking: Reported on 10/12/2023), Disp: , Rfl:    nystatin  (MYCOSTATIN /NYSTOP ) powder, Apply 1 application topically 3 (three) times daily. (Patient not taking: Reported on 10/12/2023), Disp: 15 g, Rfl: 0   promethazine -dextromethorphan (PROMETHAZINE -DM) 6.25-15 MG/5ML syrup, Take 5 mLs by mouth 4 (four) times daily as needed. (Patient not taking: Reported on 10/12/2023), Disp: 180 mL, Rfl: 0   Labs:  Lab Results  Component Value Date   HGBA1C 5.2 02/02/2021   Lab Results  Component Value Date   CHOL 201 (H) 09/04/2022   HDL 59.30 09/04/2022   LDLCALC 123 (H) 09/04/2022   TRIG 93.0 09/04/2022   CHOLHDL 3 09/04/2022   Notable Signs/Symptoms:  Wt Readings from Last 3 Encounters:  10/12/23 237 lb 14.4 oz (107.9 kg)  06/01/23 229 lb 2 oz (103.9 kg)  01/05/23  233 lb (105.7 kg)   Lifestyle & Dietary Hx Pt present today alone Pt reports she work full tim mostly sitting. Pt reports she desires weight reductions to a lower BMI range. Pt states she has hypertension and health anxiety issues. Pt reports she only has one kidney and this concerns her in addition. Pt reports following a plant based meal pattern and will accepts chicken and seafood yet desires to consume less animal products. Pt states she snores and reports poor sleep; RD encouraged to share these finding with PCP.  Pt reports a hx of [employer] and successful weight reductions with rebound weight gain. Pt reports he's does the  cooking and her wife does the shopping. Pt reports plantar fasciitis has decreased physical activity recently.  Pt reports she in currently in therapy currently 6 weeks both individual and couples. Pt reports she eats out 2-3 weekly. All Pt's questions were answered during this encounter.     Estimated daily fluid intake: ~80 oz Supplements: Ashwagandha , bloom  Sleep: poor   Stress / self-care: 7 out 10 / baths, walk the dog,  Current average weekly physical activity: 20-30 minutes walking the dog 6 days weekly    24-Hr Dietary Recall First Meal: stove top oatmeal ~ 1 cup made with water ,  butter,  sugar,  cinnamon, almonds, coffee with half and half creamer and sugar added  Snack: [place] protein or kind bar Second Meal: pinto beans, macaroni and cheese, greens, biscuit  Snack: nuts or fruit or chocolate  Third Meal: sushi, egg drop soup, crab rangoon, water  Snack: chips, ice cream, yogurt Beverages: water , coconut water , diet soda   NUTRITION DIAGNOSIS  NB-1.1 Food and nutrition-related knowledge deficit As related to limited prior nutrition related education.  As evidenced by Pt's report and dietary recall.   NUTRITION INTERVENTION  Nutrition education (E-1) on the following topics:  Fruits & Vegetables: Aim to fill half your plate with a variety of fruits and vegetables. They are rich in vitamins, minerals, and fiber, and can help reduce the risk of chronic diseases. Choose a colorful assortment of fruits and vegetables to ensure you get a wide range of nutrients. Grains and Starches: Make at least half of your grain choices whole grains, such as brown rice, whole wheat bread, and oats. Whole grains provide fiber, which aids in digestion and healthy cholesterol levels. Aim for whole forms of starchy vegetables such as potatoes, sweet potatoes, beans, peas, and corn, which are fiber rich and provide many vitamins and minerals.  Protein: Incorporate lean sources of plant based  protein including meat alteratives, beans, nuts, and seeds, into your meals. Protein is essential for building and repairing tissues, staying full, balancing blood sugar, as well as supporting immune function. Dairy: Include low-fat or fat-free dairy products like milk, yogurt, and cheese in your diet. Dairy foods are excellent sources of calcium and vitamin D , which are crucial for bone health.  Physical Activity: Aim for 60 minutes of physical activity daily. Regular physical activity promotes overall health-including helping to reduce risk for heart disease and diabetes, promoting mental health, and helping us  sleep better.    Handouts Provided Include  Vegetarian protein Options Snack Options Plate Planner Vegetarian  Learning Style & Readiness for Change Teaching method utilized: Visual & Auditory  Demonstrated degree of understanding via: Teach Back  Barriers to learning/adherence to lifestyle change: unknown  Goals Established by Pt Meal prep using the plate planner for balance   MONITORING & EVALUATION Dietary intake,  weekly physical activity, and blood pressure  Next Steps  Patient is to return.

## 2023-10-12 ENCOUNTER — Encounter: Attending: Family Medicine | Admitting: Dietician

## 2023-10-12 VITALS — Wt 237.9 lb

## 2023-10-12 DIAGNOSIS — E669 Obesity, unspecified: Secondary | ICD-10-CM | POA: Insufficient documentation

## 2023-10-12 NOTE — Patient Instructions (Signed)
 Meal prep using the plate planner for balance

## 2023-10-17 DIAGNOSIS — M7672 Peroneal tendinitis, left leg: Secondary | ICD-10-CM | POA: Diagnosis not present

## 2023-10-17 DIAGNOSIS — M792 Neuralgia and neuritis, unspecified: Secondary | ICD-10-CM | POA: Diagnosis not present

## 2023-10-17 DIAGNOSIS — M2011 Hallux valgus (acquired), right foot: Secondary | ICD-10-CM | POA: Diagnosis not present

## 2023-10-17 DIAGNOSIS — M2012 Hallux valgus (acquired), left foot: Secondary | ICD-10-CM | POA: Diagnosis not present

## 2023-10-17 DIAGNOSIS — M722 Plantar fascial fibromatosis: Secondary | ICD-10-CM | POA: Diagnosis not present

## 2023-11-12 ENCOUNTER — Other Ambulatory Visit: Payer: Self-pay | Admitting: Family Medicine

## 2023-11-12 MED ORDER — METOPROLOL SUCCINATE ER 100 MG PO TB24
100.0000 mg | ORAL_TABLET | Freq: Every day | ORAL | 1 refills | Status: DC
  Filled 2023-11-12: qty 90, 90d supply, fill #0
  Filled 2023-12-16 – 2024-02-18 (×3): qty 90, 90d supply, fill #1

## 2023-11-13 ENCOUNTER — Other Ambulatory Visit (HOSPITAL_COMMUNITY): Payer: Self-pay

## 2023-11-13 DIAGNOSIS — F419 Anxiety disorder, unspecified: Secondary | ICD-10-CM | POA: Diagnosis not present

## 2023-11-14 NOTE — Progress Notes (Unsigned)
 Medical Nutrition Therapy  Appointment Start time:  13  Appointment End time:  1445   Primary concerns today: weight management  Referral diagnosis: employee visit 2 Preferred learning style:  no preference indicated Learning readiness:  ready-change in progress   NUTRITION ASSESSMENT  Clinical Medical Hx:  Past Medical History:  Diagnosis Date   Anxiety    due to school studies   Complication of anesthesia    GERD (gastroesophageal reflux disease) 08/08/2017   History of kidney disease    Hypertension    PONV (postoperative nausea and vomiting)     Medications:  Current Outpatient Medications:    acetaminophen  (TYLENOL ) 500 MG tablet, Take 1,000 mg by mouth once as needed for mild pain or headache., Disp: , Rfl:    albuterol  (VENTOLIN  HFA) 108 (90 Base) MCG/ACT inhaler, Inhale 2 puffs into the lungs every 4 (four) hours as needed for wheezing or shortness of breath (coughing fits)., Disp: 6.7 g, Rfl: 0   ALPRAZolam  (XANAX ) 0.5 MG tablet, Take 1 tablet (0.5 mg total) by mouth at bedtime as needed for anxiety., Disp: 30 tablet, Rfl: 3   Blood Pressure Monitoring (OMRON 3 SERIES BP MONITOR) DEVI, Use as directed, Disp: 1 each, Rfl: 0   cyclobenzaprine  (FLEXERIL ) 5 MG tablet, Take 1 tablet (5 mg total) by mouth 3 (three) times daily as needed for muscle spasms., Disp: 30 tablet, Rfl: 1   docusate sodium  (COLACE) 100 MG capsule, Take 1 capsule (100 mg total) by mouth 2 (two) times daily., Disp: 40 capsule, Rfl: 0   estradiol  (ESTRACE ) 0.5 MG tablet, Take 1 tablet (0.5 mg total) by mouth every evening., Disp: 90 tablet, Rfl: 1   ipratropium (ATROVENT ) 0.03 % nasal spray, Place 1-2 sprays into both nostrils 2 (two) times daily as needed (nasal drainage)., Disp: 30 mL, Rfl: 5   loratadine (CLARITIN) 10 MG tablet, Take 10 mg by mouth daily. (Patient not taking: Reported on 10/12/2023), Disp: , Rfl:    metoprolol  succinate (TOPROL -XL) 100 MG 24 hr tablet, Take 1 tablet (100 mg total) by  mouth daily. Take with or immediately following a meal., Disp: 90 tablet, Rfl: 1   Multiple Vitamin (MULTIVITAMIN) tablet, Take 1 tablet by mouth daily., Disp: , Rfl:    nystatin  (MYCOSTATIN /NYSTOP ) powder, Apply 1 application topically 3 (three) times daily. (Patient not taking: Reported on 10/12/2023), Disp: 15 g, Rfl: 0   ondansetron  (ZOFRAN ) 4 MG tablet, Take 1 tablet (4 mg total) by mouth every 8 (eight) hours as needed for nausea or vomiting., Disp: 20 tablet, Rfl: 0   pantoprazole  (PROTONIX ) 40 MG tablet, Take 1 tablet (40 mg total) by mouth 2 (two) times daily., Disp: 180 tablet, Rfl: 1   promethazine -dextromethorphan (PROMETHAZINE -DM) 6.25-15 MG/5ML syrup, Take 5 mLs by mouth 4 (four) times daily as needed. (Patient not taking: Reported on 10/12/2023), Disp: 180 mL, Rfl: 0   traMADol  (ULTRAM ) 50 MG tablet, Take 1 tablet (50 mg total) by mouth every 6 (six) hours as needed., Disp: 30 tablet, Rfl: 0   Labs:  Lab Results  Component Value Date   CHOL 201 (H) 09/04/2022   HDL 59.30 09/04/2022   LDLCALC 123 (H) 09/04/2022   TRIG 93.0 09/04/2022   CHOLHDL 3 09/04/2022   Notable Signs/Symptoms:  BP Readings from Last 3 Encounters:  06/01/23 118/72  01/05/23 124/74  10/05/22 126/70   Lifestyle & Dietary Hx  Pt presents today alone for follow up. Pt reports she has been meal prepping for breakfast and lunch  some of the time. Pt reports a decrease in physical activity r/t plantar fasciitis and has an upcoming follow up appointment. Pt reports she has noticed stress eating and is aiming for avoiding skipping meals. Pt reports she is working on meal prepping and states stocking up on healthy options. Pt desires to promote weight reductions to a lower BMI range. Pt reports she continues with therapy. Pt reports following a plant based meal pattern and will accept poultry and seafood, yet desires to consume less animal products. Pt reports she eats out about 2-3 weekly and would like to decrease. Pt  reports she aims to take half the meal home for the next day when dining out. Pt states she would like to continue her goal to meal prep now at dinner. All Pt's questions were answered during this encounter.    Estimated daily fluid intake: ~80 oz Supplements: Ashwagandha, melatonin Sleep: improving  Stress / self-care: 4 out 10 / baths, walk the dog,  Current average weekly physical activity: 20 minutes walking the dog 3-4 days weekly  24-Hr Dietary Recall First Meal: granola, greek yogurt, berries, coffee with half and half or mini kind bar  Snack: zucchini muffin  Second Meal: salad with pumpkin seeds and dressing or lima beans with home made bread or tuna packet, salad, cheese stick, pretzels Snack: ice cream or yogurt  sometime chocolate  Third Meal: boca burger, grilled onions, mushroom, swiss cheese, 2 slices white bread, sweet potato chips Snack: none reported  Beverages: water , coconut water , diet soda, coffee plain or with half and half    NUTRITION DIAGNOSIS  NB-1.1 Food and nutrition-related knowledge deficit As related to limited prior nutrition related education.  As evidenced by Pt's report and dietary recall.-continue, improving  Handouts Provided Include  Vegetarian protein Options Snack Options Plate Planner Vegetarian  Learning Style & Readiness for Change Teaching method utilized: Visual & Auditory  Demonstrated degree of understanding via: Teach Back  Barriers to learning/adherence to lifestyle change: unknown  Goals Established by Pt Meal prep using the plate planner for balance

## 2023-11-23 ENCOUNTER — Encounter: Attending: Family Medicine | Admitting: Dietician

## 2023-11-23 DIAGNOSIS — E669 Obesity, unspecified: Secondary | ICD-10-CM | POA: Insufficient documentation

## 2023-11-27 DIAGNOSIS — M2012 Hallux valgus (acquired), left foot: Secondary | ICD-10-CM | POA: Diagnosis not present

## 2023-11-27 DIAGNOSIS — M722 Plantar fascial fibromatosis: Secondary | ICD-10-CM | POA: Diagnosis not present

## 2023-11-27 DIAGNOSIS — M792 Neuralgia and neuritis, unspecified: Secondary | ICD-10-CM | POA: Diagnosis not present

## 2023-11-27 DIAGNOSIS — M7672 Peroneal tendinitis, left leg: Secondary | ICD-10-CM | POA: Diagnosis not present

## 2023-11-27 DIAGNOSIS — M2011 Hallux valgus (acquired), right foot: Secondary | ICD-10-CM | POA: Diagnosis not present

## 2023-12-16 ENCOUNTER — Other Ambulatory Visit: Payer: Self-pay | Admitting: Family Medicine

## 2023-12-17 ENCOUNTER — Other Ambulatory Visit (HOSPITAL_COMMUNITY): Payer: Self-pay

## 2023-12-17 MED ORDER — ALPRAZOLAM 0.5 MG PO TABS
0.5000 mg | ORAL_TABLET | Freq: Every evening | ORAL | 3 refills | Status: AC | PRN
  Filled 2023-12-17: qty 30, 30d supply, fill #0

## 2023-12-17 NOTE — Telephone Encounter (Signed)
 Requested Prescriptions   Pending Prescriptions Disp Refills   ALPRAZolam  (XANAX ) 0.5 MG tablet 30 tablet 3    Sig: Take 1 tablet (0.5 mg total) by mouth at bedtime as needed for anxiety.     Date of patient request: 12/17/2023 Last office visit: 06/01/2023 Upcoming visit: Visit date not found Date of last refill: 06/15/2023 Last refill amount: 30

## 2023-12-18 ENCOUNTER — Other Ambulatory Visit (HOSPITAL_COMMUNITY): Payer: Self-pay

## 2023-12-18 ENCOUNTER — Other Ambulatory Visit: Payer: Self-pay

## 2023-12-19 DIAGNOSIS — M2012 Hallux valgus (acquired), left foot: Secondary | ICD-10-CM | POA: Diagnosis not present

## 2023-12-19 DIAGNOSIS — M722 Plantar fascial fibromatosis: Secondary | ICD-10-CM | POA: Diagnosis not present

## 2023-12-19 DIAGNOSIS — M2011 Hallux valgus (acquired), right foot: Secondary | ICD-10-CM | POA: Diagnosis not present

## 2023-12-19 DIAGNOSIS — M7672 Peroneal tendinitis, left leg: Secondary | ICD-10-CM | POA: Diagnosis not present

## 2024-01-09 DIAGNOSIS — F419 Anxiety disorder, unspecified: Secondary | ICD-10-CM | POA: Diagnosis not present

## 2024-01-18 NOTE — Progress Notes (Deleted)
 Medical Nutrition Therapy  Appointment Start time:  ***  Appointment End time: ***   Primary concerns today: weight management  Referral diagnosis: employee visit 3 Preferred learning style:  no preference indicated Learning readiness:  ready-change in progress   NUTRITION ASSESSMENT  Clinical Medical Hx:  Past Medical History:  Diagnosis Date   Anxiety    due to school studies   Complication of anesthesia    GERD (gastroesophageal reflux disease) 08/08/2017   History of kidney disease    Hypertension    PONV (postoperative nausea and vomiting)     Medications:  Current Outpatient Medications:    acetaminophen  (TYLENOL ) 500 MG tablet, Take 1,000 mg by mouth once as needed for mild pain or headache., Disp: , Rfl:    albuterol  (VENTOLIN  HFA) 108 (90 Base) MCG/ACT inhaler, Inhale 2 puffs into the lungs every 4 (four) hours as needed for wheezing or shortness of breath (coughing fits)., Disp: 6.7 g, Rfl: 0   ALPRAZolam  (XANAX ) 0.5 MG tablet, Take 1 tablet (0.5 mg total) by mouth at bedtime as needed for anxiety., Disp: 30 tablet, Rfl: 3   Blood Pressure Monitoring (OMRON 3 SERIES BP MONITOR) DEVI, Use as directed, Disp: 1 each, Rfl: 0   cyclobenzaprine  (FLEXERIL ) 5 MG tablet, Take 1 tablet (5 mg total) by mouth 3 (three) times daily as needed for muscle spasms., Disp: 30 tablet, Rfl: 1   docusate sodium  (COLACE) 100 MG capsule, Take 1 capsule (100 mg total) by mouth 2 (two) times daily., Disp: 40 capsule, Rfl: 0   estradiol  (ESTRACE ) 0.5 MG tablet, Take 1 tablet (0.5 mg total) by mouth every evening., Disp: 90 tablet, Rfl: 1   ipratropium (ATROVENT ) 0.03 % nasal spray, Place 1-2 sprays into both nostrils 2 (two) times daily as needed (nasal drainage)., Disp: 30 mL, Rfl: 5   loratadine (CLARITIN) 10 MG tablet, Take 10 mg by mouth daily. (Patient not taking: Reported on 10/12/2023), Disp: , Rfl:    metoprolol  succinate (TOPROL -XL) 100 MG 24 hr tablet, Take 1 tablet (100 mg total) by mouth  daily. Take with or immediately following a meal., Disp: 90 tablet, Rfl: 1   Multiple Vitamin (MULTIVITAMIN) tablet, Take 1 tablet by mouth daily., Disp: , Rfl:    nystatin  (MYCOSTATIN /NYSTOP ) powder, Apply 1 application topically 3 (three) times daily. (Patient not taking: Reported on 10/12/2023), Disp: 15 g, Rfl: 0   ondansetron  (ZOFRAN ) 4 MG tablet, Take 1 tablet (4 mg total) by mouth every 8 (eight) hours as needed for nausea or vomiting., Disp: 20 tablet, Rfl: 0   pantoprazole  (PROTONIX ) 40 MG tablet, Take 1 tablet (40 mg total) by mouth 2 (two) times daily., Disp: 180 tablet, Rfl: 1   promethazine -dextromethorphan (PROMETHAZINE -DM) 6.25-15 MG/5ML syrup, Take 5 mLs by mouth 4 (four) times daily as needed. (Patient not taking: Reported on 10/12/2023), Disp: 180 mL, Rfl: 0   traMADol  (ULTRAM ) 50 MG tablet, Take 1 tablet (50 mg total) by mouth every 6 (six) hours as needed., Disp: 30 tablet, Rfl: 0   Labs:  Lab Results  Component Value Date   CHOL 201 (H) 09/04/2022   HDL 59.30 09/04/2022   LDLCALC 123 (H) 09/04/2022   TRIG 93.0 09/04/2022   CHOLHDL 3 09/04/2022   Notable Signs/Symptoms:  BP Readings from Last 3 Encounters:  06/01/23 118/72  01/05/23 124/74  10/05/22 126/70   Lifestyle & Dietary Hx    **** Pt presents today alone for follow up. Pt reports she has been meal prepping for breakfast  and lunch some of the time. Pt reports a decrease in physical activity r/t plantar fasciitis and has an upcoming follow up appointment. Pt reports she has noticed stress eating and is aiming for avoiding skipping meals. Pt reports she is working on meal prepping and states stocking up on healthy options. Pt desires to promote weight reductions to a lower BMI range. Pt reports she continues with therapy. Pt reports following a plant based meal pattern and will accept poultry and seafood, yet desires to consume less animal products. Pt reports she eats out about 2-3 weekly and would like to  decrease. Pt reports she aims to take half the meal home for the next day when dining out. Pt states she would like to continue her goal to meal prep now at dinner. All Pt's questions were answered during this encounter.    Estimated daily fluid intake: ~80 oz Supplements: Ashwagandha, melatonin Sleep: improving  Stress / self-care: 4 out 10 / baths, walk the dog,  Current average weekly physical activity: 20 minutes walking the dog 3-4 days weekly  24-Hr Dietary Recall First Meal: granola, greek yogurt, berries, coffee with half and half or mini kind bar  Snack: zucchini muffin  Second Meal: salad with pumpkin seeds and dressing or lima beans with home made bread or tuna packet, salad, cheese stick, pretzels Snack: ice cream or yogurt  sometime chocolate  Third Meal: boca burger, grilled onions, mushroom, swiss cheese, 2 slices white bread, sweet potato chips Snack: none reported  Beverages: water , coconut water , diet soda, coffee plain or with half and half    NUTRITION DIAGNOSIS  NB-1.1 Food and nutrition-related knowledge deficit As related to limited prior nutrition related education.  As evidenced by Pt's report and dietary recall.-continue, improving  Handouts Provided Include  Vegetarian protein Options Snack Options Plate Planner Vegetarian  Learning Style & Readiness for Change Teaching method utilized: Visual & Auditory  Demonstrated degree of understanding via: Teach Back  Barriers to learning/adherence to lifestyle change: unknown  Goals Established by Pt Meal prep using the plate planner for balance    Start: *** end: *** Patient is here today *** Patient would like to learn *** Patient lives with ***.  *** shopping and cooking.  History includes:  *** Medications include:  *** Labs noted:  ***

## 2024-01-25 ENCOUNTER — Ambulatory Visit: Admitting: Dietician

## 2024-01-25 DIAGNOSIS — E669 Obesity, unspecified: Secondary | ICD-10-CM

## 2024-02-18 ENCOUNTER — Other Ambulatory Visit (HOSPITAL_COMMUNITY): Payer: Self-pay

## 2024-03-05 DIAGNOSIS — F419 Anxiety disorder, unspecified: Secondary | ICD-10-CM | POA: Diagnosis not present

## 2024-03-21 ENCOUNTER — Other Ambulatory Visit: Payer: Self-pay | Admitting: Allergy

## 2024-03-21 ENCOUNTER — Other Ambulatory Visit: Payer: Self-pay

## 2024-03-21 ENCOUNTER — Other Ambulatory Visit (HOSPITAL_COMMUNITY): Payer: Self-pay

## 2024-03-21 ENCOUNTER — Encounter (HOSPITAL_COMMUNITY): Payer: Self-pay

## 2024-03-24 ENCOUNTER — Other Ambulatory Visit: Payer: Self-pay

## 2024-04-04 ENCOUNTER — Other Ambulatory Visit (HOSPITAL_COMMUNITY): Payer: Self-pay

## 2024-04-04 ENCOUNTER — Ambulatory Visit: Payer: Self-pay | Admitting: Family Medicine

## 2024-04-04 ENCOUNTER — Ambulatory Visit: Admitting: Family Medicine

## 2024-04-04 ENCOUNTER — Encounter: Payer: Self-pay | Admitting: Family Medicine

## 2024-04-04 VITALS — BP 130/82 | HR 59 | Temp 98.8°F | Ht 66.0 in | Wt 240.8 lb

## 2024-04-04 DIAGNOSIS — E669 Obesity, unspecified: Secondary | ICD-10-CM | POA: Diagnosis not present

## 2024-04-04 DIAGNOSIS — Z79890 Hormone replacement therapy (postmenopausal): Secondary | ICD-10-CM

## 2024-04-04 DIAGNOSIS — R0982 Postnasal drip: Secondary | ICD-10-CM

## 2024-04-04 DIAGNOSIS — L299 Pruritus, unspecified: Secondary | ICD-10-CM

## 2024-04-04 LAB — CBC WITH DIFFERENTIAL/PLATELET
Basophils Absolute: 0 K/uL (ref 0.0–0.1)
Basophils Relative: 0.7 % (ref 0.0–3.0)
Eosinophils Absolute: 0.2 K/uL (ref 0.0–0.7)
Eosinophils Relative: 3.5 % (ref 0.0–5.0)
HCT: 42.5 % (ref 36.0–46.0)
Hemoglobin: 14.3 g/dL (ref 12.0–15.0)
Lymphocytes Relative: 37.1 % (ref 12.0–46.0)
Lymphs Abs: 2.1 K/uL (ref 0.7–4.0)
MCHC: 33.8 g/dL (ref 30.0–36.0)
MCV: 90.4 fl (ref 78.0–100.0)
Monocytes Absolute: 0.6 K/uL (ref 0.1–1.0)
Monocytes Relative: 10.4 % (ref 3.0–12.0)
Neutro Abs: 2.7 K/uL (ref 1.4–7.7)
Neutrophils Relative %: 48.3 % (ref 43.0–77.0)
Platelets: 271 K/uL (ref 150.0–400.0)
RBC: 4.7 Mil/uL (ref 3.87–5.11)
RDW: 13.8 % (ref 11.5–15.5)
WBC: 5.7 K/uL (ref 4.0–10.5)

## 2024-04-04 LAB — HEPATIC FUNCTION PANEL
ALT: 21 U/L (ref 0–35)
AST: 22 U/L (ref 0–37)
Albumin: 4.6 g/dL (ref 3.5–5.2)
Alkaline Phosphatase: 63 U/L (ref 39–117)
Bilirubin, Direct: 0.1 mg/dL (ref 0.0–0.3)
Total Bilirubin: 0.4 mg/dL (ref 0.2–1.2)
Total Protein: 7.5 g/dL (ref 6.0–8.3)

## 2024-04-04 LAB — BASIC METABOLIC PANEL WITH GFR
BUN: 11 mg/dL (ref 6–23)
CO2: 28 meq/L (ref 19–32)
Calcium: 9.8 mg/dL (ref 8.4–10.5)
Chloride: 103 meq/L (ref 96–112)
Creatinine, Ser: 0.76 mg/dL (ref 0.40–1.20)
GFR: 90.09 mL/min (ref >60.00)
Glucose, Bld: 94 mg/dL (ref 70–99)
Potassium: 4.6 meq/L (ref 3.5–5.1)
Sodium: 139 meq/L (ref 135–145)

## 2024-04-04 LAB — TSH: TSH: 1.94 u[IU]/mL (ref 0.35–5.50)

## 2024-04-04 MED ORDER — IPRATROPIUM BROMIDE 0.03 % NA SOLN
1.0000 | Freq: Two times a day (BID) | NASAL | 5 refills | Status: AC | PRN
  Filled 2024-04-04: qty 30, 75d supply, fill #0
  Filled 2024-04-04: qty 30, 30d supply, fill #0

## 2024-04-04 MED ORDER — HYDROXYZINE PAMOATE 25 MG PO CAPS
25.0000 mg | ORAL_CAPSULE | Freq: Three times a day (TID) | ORAL | 0 refills | Status: DC | PRN
  Filled 2024-04-04 (×2): qty 30, 10d supply, fill #0

## 2024-04-04 MED ORDER — FLUTICASONE PROPIONATE 50 MCG/ACT NA SUSP
2.0000 | Freq: Every day | NASAL | 6 refills | Status: AC
Start: 2024-04-04 — End: ?
  Filled 2024-04-04 (×2): qty 16, 30d supply, fill #0
  Filled 2024-05-05: qty 16, 30d supply, fill #1
  Filled 2024-06-05: qty 16, 30d supply, fill #2

## 2024-04-04 NOTE — Patient Instructions (Addendum)
 Schedule your complete physical at your convenience We'll notify you of your lab results and make any changes if needed [facility] and [facility] INCREASE the Estrace  to 0.5mg  nightly USE the Hydroxyzine as needed for itching/insomnia Continue to work on healthy diet and regular exercise- you can do it! Call with any questions or concerns Stay Safe!  Stay Healthy! Happy Fall!

## 2024-04-04 NOTE — Progress Notes (Signed)
   Subjective:    Patient ID: Jackie Mcconnell, female    DOB: [birth year], 53 y.o.   MRN: [id number]  HPI Itching- pt reports sxs started over a year ago.  Itching is worse at night or when she is still.  This keeps her from sleeping.  She is concerned b/c uncle had a liver transplant and he had similar itching.    HRT- currently prescribed Estrace  0.5mg  nightly but only taking 1/2 tab.  Had TAH BSO at age 39.  She is planning to increase dose to 0.5mg .  Due for mammo.  Obesity- pt has gained 12 lbs since last visit.  BMI 38.87.  Pt would like a letter to join the gym so she can use FSA.   Review of Systems For ROS see HPI     Objective:   Physical Exam Vitals reviewed.  Constitutional:      General: She is not in acute distress.    Appearance: Normal appearance. She is well-developed. She is obese. She is not ill-appearing.  HENT:     Head: Normocephalic and atraumatic.  Eyes:     Conjunctiva/sclera: Conjunctivae normal.     Pupils: Pupils are equal, round, and reactive to light.  Neck:     Thyroid : No thyromegaly.  Cardiovascular:     Rate and Rhythm: Normal rate and regular rhythm.     Heart sounds: Normal heart sounds. No murmur heard. Pulmonary:     Effort: Pulmonary effort is normal. No respiratory distress.     Breath sounds: Normal breath sounds.  Abdominal:     General: There is no distension.     Palpations: Abdomen is soft.     Tenderness: There is no abdominal tenderness.  Musculoskeletal:     Cervical back: Normal range of motion and neck supple.  Lymphadenopathy:     Cervical: No cervical adenopathy.  Skin:    General: Skin is warm and dry.     Findings: No erythema or rash.  Neurological:     General: No focal deficit present.     Mental Status: She is alert and oriented to person, place, and time.  Psychiatric:        Mood and Affect: Mood normal.        Behavior: Behavior normal.        Thought Content: Thought content normal.            Assessment & Plan:   Pruritus- new.  Pt is fearful that she has a liver d/o as her uncle had similar sxs prior to needing a liver transplant.  Will check labs to assess for underlying cause.  Discussed that this might be hormonal or stress related.  Will start Hydroxyzine prn.  Pt expressed understanding and is in agreement w/ plan.

## 2024-04-05 LAB — IRON,TIBC AND FERRITIN PANEL
%SAT: 15 % — ABNORMAL LOW (ref 16–45)
Ferritin: 11 ng/mL — ABNORMAL LOW (ref 16–232)
Iron: 61 ug/dL (ref 45–160)
TIBC: 403 ug/dL (ref 250–450)

## 2024-04-07 NOTE — Progress Notes (Signed)
 Pt has reviewed via MyChart

## 2024-04-09 DIAGNOSIS — F419 Anxiety disorder, unspecified: Secondary | ICD-10-CM | POA: Diagnosis not present

## 2024-04-10 ENCOUNTER — Ambulatory Visit: Admitting: Family Medicine

## 2024-04-13 NOTE — Assessment & Plan Note (Signed)
 Deteriorated.  Pt has gained 12 lbs since last visit and BMI is now 38.87.  Letter provided for her to use her FSA to join a gym.  Applauded her interest.

## 2024-04-13 NOTE — Assessment & Plan Note (Signed)
 Encouraged pt to increase her Estrace  to a full tab- 0.5mg - daily.  Stressed need to schedule mammogram given HRT.

## 2024-05-05 ENCOUNTER — Other Ambulatory Visit: Payer: Self-pay | Admitting: Family Medicine

## 2024-05-05 ENCOUNTER — Other Ambulatory Visit (HOSPITAL_COMMUNITY): Payer: Self-pay

## 2024-05-06 ENCOUNTER — Other Ambulatory Visit (HOSPITAL_COMMUNITY): Payer: Self-pay

## 2024-05-06 ENCOUNTER — Other Ambulatory Visit: Payer: Self-pay | Admitting: Family Medicine

## 2024-05-06 ENCOUNTER — Other Ambulatory Visit: Payer: Self-pay

## 2024-05-06 DIAGNOSIS — Z1231 Encounter for screening mammogram for malignant neoplasm of breast: Secondary | ICD-10-CM

## 2024-05-06 MED ORDER — METOPROLOL SUCCINATE ER 100 MG PO TB24
100.0000 mg | ORAL_TABLET | Freq: Every day | ORAL | 1 refills | Status: AC
  Filled 2024-05-06: qty 90, 90d supply, fill #0

## 2024-05-06 MED ORDER — HYDROXYZINE PAMOATE 25 MG PO CAPS
25.0000 mg | ORAL_CAPSULE | Freq: Three times a day (TID) | ORAL | 0 refills | Status: DC | PRN
  Filled 2024-05-06: qty 30, 10d supply, fill #0

## 2024-05-30 ENCOUNTER — Ambulatory Visit
Admission: RE | Admit: 2024-05-30 | Discharge: 2024-05-30 | Disposition: A | Source: Ambulatory Visit | Attending: Family Medicine | Admitting: Family Medicine

## 2024-05-30 DIAGNOSIS — Z1231 Encounter for screening mammogram for malignant neoplasm of breast: Secondary | ICD-10-CM

## 2024-06-05 ENCOUNTER — Other Ambulatory Visit: Payer: Self-pay | Admitting: Family Medicine

## 2024-06-06 ENCOUNTER — Other Ambulatory Visit (HOSPITAL_COMMUNITY): Payer: Self-pay

## 2024-06-06 ENCOUNTER — Other Ambulatory Visit: Payer: Self-pay

## 2024-06-06 MED ORDER — HYDROXYZINE PAMOATE 25 MG PO CAPS
25.0000 mg | ORAL_CAPSULE | Freq: Three times a day (TID) | ORAL | 0 refills | Status: DC | PRN
  Filled 2024-06-06: qty 30, 10d supply, fill #0

## 2024-06-06 MED ORDER — ESTRADIOL 0.5 MG PO TABS
0.5000 mg | ORAL_TABLET | Freq: Every evening | ORAL | 1 refills | Status: AC
  Filled 2024-06-06: qty 90, 90d supply, fill #0

## 2024-06-06 MED ORDER — TRAMADOL HCL 50 MG PO TABS
50.0000 mg | ORAL_TABLET | Freq: Four times a day (QID) | ORAL | 0 refills | Status: AC | PRN
  Filled 2024-06-06: qty 30, 8d supply, fill #0

## 2024-06-06 NOTE — Telephone Encounter (Signed)
 Requested Prescriptions   Pending Prescriptions Disp Refills   estradiol  (ESTRACE ) 0.5 MG tablet 90 tablet 1    Sig: Take 1 tablet (0.5 mg total) by mouth every evening.   traMADol  (ULTRAM ) 50 MG tablet 30 tablet 0    Sig: Take 1 tablet (50 mg total) by mouth every 6 (six) hours as needed.   hydrOXYzine  (VISTARIL ) 25 MG capsule 30 capsule 0    Sig: Take 1 capsule (25 mg total) by mouth every 8 (eight) hours as needed.     Date of patient request: 05/07/24 Last office visit: 05/05/2024 Upcoming visit: 06/24/2024 Date of last refill: 08/21/23 Last refill amount: 30

## 2024-06-24 ENCOUNTER — Other Ambulatory Visit: Payer: Self-pay

## 2024-06-24 ENCOUNTER — Encounter: Payer: Self-pay | Admitting: Family Medicine

## 2024-06-24 ENCOUNTER — Ambulatory Visit (INDEPENDENT_AMBULATORY_CARE_PROVIDER_SITE_OTHER): Admitting: Family Medicine

## 2024-06-24 ENCOUNTER — Other Ambulatory Visit (HOSPITAL_COMMUNITY): Payer: Self-pay

## 2024-06-24 VITALS — BP 122/74 | HR 61 | Temp 97.9°F | Resp 12 | Ht 67.13 in | Wt 243.2 lb

## 2024-06-24 DIAGNOSIS — M2012 Hallux valgus (acquired), left foot: Secondary | ICD-10-CM | POA: Insufficient documentation

## 2024-06-24 DIAGNOSIS — I10 Essential (primary) hypertension: Secondary | ICD-10-CM | POA: Insufficient documentation

## 2024-06-24 DIAGNOSIS — R635 Abnormal weight gain: Secondary | ICD-10-CM | POA: Insufficient documentation

## 2024-06-24 DIAGNOSIS — M2011 Hallux valgus (acquired), right foot: Secondary | ICD-10-CM | POA: Insufficient documentation

## 2024-06-24 DIAGNOSIS — Z0000 Encounter for general adult medical examination without abnormal findings: Secondary | ICD-10-CM | POA: Diagnosis not present

## 2024-06-24 DIAGNOSIS — M7672 Peroneal tendinitis, left leg: Secondary | ICD-10-CM | POA: Insufficient documentation

## 2024-06-24 LAB — LIPID PANEL
Cholesterol: 216 mg/dL — ABNORMAL HIGH (ref 28–200)
HDL: 54.2 mg/dL
LDL Cholesterol: 139 mg/dL — ABNORMAL HIGH (ref 10–99)
NonHDL: 161.79
Total CHOL/HDL Ratio: 4
Triglycerides: 114 mg/dL (ref 10.0–149.0)
VLDL: 22.8 mg/dL (ref 0.0–40.0)

## 2024-06-24 LAB — BASIC METABOLIC PANEL WITH GFR
BUN: 10 mg/dL (ref 6–23)
CO2: 30 meq/L (ref 19–32)
Calcium: 9.2 mg/dL (ref 8.4–10.5)
Chloride: 103 meq/L (ref 96–112)
Creatinine, Ser: 0.79 mg/dL (ref 0.40–1.20)
GFR: 85.87 mL/min
Glucose, Bld: 88 mg/dL (ref 70–99)
Potassium: 4.5 meq/L (ref 3.5–5.1)
Sodium: 139 meq/L (ref 135–145)

## 2024-06-24 LAB — HEPATIC FUNCTION PANEL
ALT: 20 U/L (ref 3–35)
AST: 20 U/L (ref 5–37)
Albumin: 4.3 g/dL (ref 3.5–5.2)
Alkaline Phosphatase: 58 U/L (ref 39–117)
Bilirubin, Direct: 0.1 mg/dL (ref 0.1–0.3)
Total Bilirubin: 0.5 mg/dL (ref 0.2–1.2)
Total Protein: 6.9 g/dL (ref 6.0–8.3)

## 2024-06-24 LAB — CBC WITH DIFFERENTIAL/PLATELET
Basophils Absolute: 0 K/uL (ref 0.0–0.1)
Basophils Relative: 0.7 % (ref 0.0–3.0)
Eosinophils Absolute: 0.2 K/uL (ref 0.0–0.7)
Eosinophils Relative: 3.9 % (ref 0.0–5.0)
HCT: 42.8 % (ref 36.0–46.0)
Hemoglobin: 14.8 g/dL (ref 12.0–15.0)
Lymphocytes Relative: 29.7 % (ref 12.0–46.0)
Lymphs Abs: 1.7 K/uL (ref 0.7–4.0)
MCHC: 34.6 g/dL (ref 30.0–36.0)
MCV: 91.2 fl (ref 78.0–100.0)
Monocytes Absolute: 0.5 K/uL (ref 0.1–1.0)
Monocytes Relative: 9.3 % (ref 3.0–12.0)
Neutro Abs: 3.2 K/uL (ref 1.4–7.7)
Neutrophils Relative %: 56.4 % (ref 43.0–77.0)
Platelets: 242 K/uL (ref 150.0–400.0)
RBC: 4.69 Mil/uL (ref 3.87–5.11)
RDW: 14.1 % (ref 11.5–15.5)
WBC: 5.6 K/uL (ref 4.0–10.5)

## 2024-06-24 LAB — HEMOGLOBIN A1C: Hgb A1c MFr Bld: 5.2 % (ref 4.6–6.5)

## 2024-06-24 LAB — TSH: TSH: 3.47 u[IU]/mL (ref 0.35–5.50)

## 2024-06-24 MED ORDER — METHOCARBAMOL 500 MG PO TABS
500.0000 mg | ORAL_TABLET | Freq: Three times a day (TID) | ORAL | 0 refills | Status: AC | PRN
  Filled 2024-06-24: qty 30, 10d supply, fill #0

## 2024-06-24 MED ORDER — HYDROXYZINE PAMOATE 25 MG PO CAPS
25.0000 mg | ORAL_CAPSULE | Freq: Three times a day (TID) | ORAL | 1 refills | Status: AC | PRN
  Filled 2024-06-24: qty 90, 30d supply, fill #0

## 2024-06-24 NOTE — Patient Instructions (Signed)
 Follow up in 6 months to recheck blood pressure We'll notify you of your lab results and make any changes if needed Continue to work on healthy diet and regular exercise- you can do it! Call with any questions or concerns Stay Safe!  Stay Healthy! Happy New Year!!!

## 2024-06-24 NOTE — Assessment & Plan Note (Signed)
Pt's PE WNL w/ exception of BMI.  UTD on mammo, colonoscopy, Tdap, flu.  Check labs.  Anticipatory guidance provided.

## 2024-06-24 NOTE — Progress Notes (Signed)
" ° °  Subjective:    Patient ID: Jackie Mcconnell, female    DOB: [birth year], 53 y.o.   MRN: [id number]  HPI CPE- UTD on mammo, colonoscopy, Tdap, flu.  Health Maintenance  Topic Date Due   Hepatitis B Vaccines 19-59 Average Risk (1 of 3 - 19+ 3-dose series) Never done   Influenza Vaccine  01/25/2024   Zoster Vaccines- Shingrix (1 of 2) 09/22/2024 (Originally [birth year]/03/1990)   Pneumococcal Vaccine: 50+ Years (1 of 2 - PCV) 06/24/2025 (Originally 11/03/1990)   Mammogram  05/30/2025   Colonoscopy  02/23/2032   DTaP/Tdap/Td (3 - Td or Tdap) 02/28/2033   Hepatitis C Screening  Completed   HPV VACCINES  Aged Out   Meningococcal B Vaccine  Aged Out   COVID-19 Vaccine  Discontinued   HIV Screening  Discontinued    Patient Care Team    Relationship Specialty Notifications Start End  Mahlon Comer BRAVO, MD PCP - General Family Medicine  09/28/16   Wonda Cy BROCKS, RD Dietitian Family Medicine  12/02/13       Review of Systems Patient reports no vision/ hearing changes, adenopathy,fever, weight change,  persistant/recurrent hoarseness , swallowing issues, chest pain, palpitations, edema, persistant/recurrent cough, hemoptysis, dyspnea (rest/exertional/paroxysmal nocturnal), gastrointestinal bleeding (melena, rectal bleeding), abdominal pain, significant heartburn, bowel changes, GU symptoms (dysuria, hematuria, incontinence), Gyn symptoms (abnormal  bleeding, pain),  syncope, focal weakness, memory loss, numbness & tingling, skin/hair/nail changes, abnormal bruising or bleeding, anxiety, or depression.     Objective:   Physical Exam General Appearance:    Alert, cooperative, no distress, appears stated age, obese  Head:    Normocephalic, without obvious abnormality, atraumatic  Eyes:    PERRL, conjunctiva/corneas clear, EOM's intact both eyes  Ears:    Normal TM's and external ear canals, both ears  Nose:   Nares normal, septum midline, mucosa normal, no drainage    or sinus tenderness  Throat:   Lips,  mucosa, and tongue normal; teeth and gums normal  Neck:   Supple, symmetrical, trachea midline, no adenopathy;    Thyroid : no enlargement/tenderness/nodules  Back:     Symmetric, no curvature, ROM normal, no CVA tenderness  Lungs:     Clear to auscultation bilaterally, respirations unlabored  Chest Wall:    No tenderness or deformity   Heart:    Regular rate and rhythm, S1 and S2 normal, no murmur, rub   or gallop  Breast Exam:    Deferred to mammo  Abdomen:     Soft, non-tender, bowel sounds active all four quadrants,    no masses, no organomegaly  Genitalia:    Deferred  Rectal:    Extremities:   Extremities normal, atraumatic, no cyanosis or edema  Pulses:   2+ and symmetric all extremities  Skin:   Skin color, texture, turgor normal, no rashes or lesions  Lymph nodes:   Cervical, supraclavicular, and axillary nodes normal  Neurologic:   CNII-XII intact, normal strength, sensation and reflexes    throughout          Assessment & Plan:    "

## 2024-06-24 NOTE — Assessment & Plan Note (Signed)
 Pt's BMI is 37.95 but coupled w/ her HTN this qualifies as morbidly obese.  Encouraged low carb diet and regular exercise.  Check labs to risk stratify.  Will follow.

## 2024-06-25 ENCOUNTER — Ambulatory Visit: Payer: Self-pay | Admitting: Family Medicine

## 2024-06-25 LAB — IRON,TIBC AND FERRITIN PANEL
%SAT: 24 % (ref 16–45)
Ferritin: 21 ng/mL (ref 16–232)
Iron: 84 ug/dL (ref 45–160)
TIBC: 344 ug/dL (ref 250–450)
